# Patient Record
Sex: Male | Born: 1955 | Hispanic: No | Marital: Married | Smoking: Never smoker
Health system: Southern US, Community
[De-identification: ages and names within clinical notes are randomized; demographics above are authoritative.]

## PROBLEM LIST (undated history)

## (undated) DIAGNOSIS — E781 Pure hyperglyceridemia: Secondary | ICD-10-CM

## (undated) DIAGNOSIS — E119 Type 2 diabetes mellitus without complications: Secondary | ICD-10-CM

## (undated) DIAGNOSIS — H9192 Unspecified hearing loss, left ear: Secondary | ICD-10-CM

## (undated) DIAGNOSIS — K219 Gastro-esophageal reflux disease without esophagitis: Secondary | ICD-10-CM

## (undated) DIAGNOSIS — E785 Hyperlipidemia, unspecified: Secondary | ICD-10-CM

## (undated) DIAGNOSIS — D239 Other benign neoplasm of skin, unspecified: Secondary | ICD-10-CM

## (undated) DIAGNOSIS — N400 Enlarged prostate without lower urinary tract symptoms: Secondary | ICD-10-CM

## (undated) DIAGNOSIS — R7989 Other specified abnormal findings of blood chemistry: Secondary | ICD-10-CM

## (undated) DIAGNOSIS — Z9641 Presence of insulin pump (external) (internal): Secondary | ICD-10-CM

## (undated) DIAGNOSIS — I8390 Asymptomatic varicose veins of unspecified lower extremity: Secondary | ICD-10-CM

## (undated) DIAGNOSIS — M48061 Spinal stenosis, lumbar region without neurogenic claudication: Secondary | ICD-10-CM

## (undated) DIAGNOSIS — J45909 Unspecified asthma, uncomplicated: Secondary | ICD-10-CM

## (undated) DIAGNOSIS — M51369 Other intervertebral disc degeneration, lumbar region without mention of lumbar back pain or lower extremity pain: Secondary | ICD-10-CM

## (undated) DIAGNOSIS — M5417 Radiculopathy, lumbosacral region: Secondary | ICD-10-CM

## (undated) DIAGNOSIS — M1990 Unspecified osteoarthritis, unspecified site: Secondary | ICD-10-CM

## (undated) DIAGNOSIS — G4700 Insomnia, unspecified: Secondary | ICD-10-CM

## (undated) HISTORY — DX: Insomnia, unspecified: G47.00

## (undated) HISTORY — DX: Hyperlipidemia, unspecified: E78.5

## (undated) HISTORY — DX: Benign prostatic hyperplasia without lower urinary tract symptoms: N40.0

## (undated) HISTORY — DX: Unspecified hearing loss, left ear: H91.92

## (undated) HISTORY — DX: Other specified abnormal findings of blood chemistry: R79.89

## (undated) HISTORY — DX: Pure hyperglyceridemia: E78.1

## (undated) HISTORY — DX: Type 2 diabetes mellitus without complications: E11.9

## (undated) HISTORY — DX: Other benign neoplasm of skin, unspecified: D23.9

---

## 1984-05-09 DIAGNOSIS — E781 Pure hyperglyceridemia: Secondary | ICD-10-CM | POA: Insufficient documentation

## 1998-12-23 DIAGNOSIS — K219 Gastro-esophageal reflux disease without esophagitis: Secondary | ICD-10-CM | POA: Insufficient documentation

## 2003-11-04 ENCOUNTER — Encounter: Admission: RE | Admit: 2003-11-04 | Discharge: 2003-11-04 | Payer: Self-pay | Admitting: Internal Medicine

## 2004-05-22 ENCOUNTER — Ambulatory Visit (HOSPITAL_COMMUNITY): Admission: RE | Admit: 2004-05-22 | Discharge: 2004-05-22 | Payer: Self-pay | Admitting: Internal Medicine

## 2005-05-21 ENCOUNTER — Encounter: Admission: RE | Admit: 2005-05-21 | Discharge: 2005-05-21 | Payer: Self-pay | Admitting: Internal Medicine

## 2006-05-09 DIAGNOSIS — E119 Type 2 diabetes mellitus without complications: Secondary | ICD-10-CM | POA: Insufficient documentation

## 2006-08-04 ENCOUNTER — Inpatient Hospital Stay (HOSPITAL_COMMUNITY): Admission: AD | Admit: 2006-08-04 | Discharge: 2006-08-05 | Payer: Self-pay | Admitting: General Surgery

## 2006-08-05 ENCOUNTER — Encounter (INDEPENDENT_AMBULATORY_CARE_PROVIDER_SITE_OTHER): Payer: Self-pay | Admitting: *Deleted

## 2006-12-23 HISTORY — PX: CHOLECYSTECTOMY: SHX55

## 2008-07-24 ENCOUNTER — Emergency Department (HOSPITAL_COMMUNITY): Admission: EM | Admit: 2008-07-24 | Discharge: 2008-07-24 | Payer: Self-pay | Admitting: Emergency Medicine

## 2008-11-08 ENCOUNTER — Ambulatory Visit: Payer: Self-pay | Admitting: Gastroenterology

## 2008-11-22 ENCOUNTER — Ambulatory Visit: Payer: Self-pay | Admitting: Gastroenterology

## 2009-03-25 ENCOUNTER — Encounter: Admission: RE | Admit: 2009-03-25 | Discharge: 2009-03-25 | Payer: Self-pay | Admitting: Orthopaedic Surgery

## 2009-05-09 DIAGNOSIS — D239 Other benign neoplasm of skin, unspecified: Secondary | ICD-10-CM | POA: Insufficient documentation

## 2011-05-10 NOTE — H&P (Signed)
NAME:  Ethan Booth, NULL NO.:  [id number]   MEDICAL RECORD NO.:  [id number]          PATIENT TYPE:  INP   LOCATION:  6709                         FACILITY:  [facility]   PHYSICIAN:  Leonie Man, M.D.   DATE OF BIRTH:  01/30/1956   DATE OF ADMISSION:  08/04/2006  DATE OF DISCHARGE:                                HISTORY & PHYSICAL   PROBLEM:  Acute cholecystitis.   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  He was evaluated at [facility] within the past 24 hours, at which time his cardiac work-up was  completely negative, and EKG, CK-MB and troponins were all within normal  limits.  He was noted to have elevated liver function studies.  He underwent  an abdominal ultrasound at [facility] here in [place] which  showed cholelithiasis with some mild gallbladder wall thickening, no  dilatation of the common bile duct.  There were multiple small stones within  the gallbladder.   PAST MEDICAL HISTORY:  1. The patient has a history of type 2 diabetes.  2. Hyperlipidemia.  3. Gastroesophageal reflux.   MEDICATIONS:  1. Actos 45 mg daily.  2. Lipitor 10 mg daily.  3. Aspirin 81 mg daily.  4. AcipHex 20 mg daily.  5. Omega 3 at 2 gm daily.  6. AndroGel two pumps daily.   PAST SURGICAL HISTORY:  A tonsillectomy and adenoidectomy in the remote  past.   ALLERGIES:  No known drug allergies.  No known substance allergies.   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  Stable supportive family.  No tobacco use.  No alcohol  use.  No illicit drug use history.   FAMILY HISTORY:  Positive for hypertension and diabetes.   REVIEW OF SYSTEMS:  Negative in detail, except as  outlined above.   PHYSICAL EXAMINATION:  GENERAL:  Well-developed, well-nourished male.  VITAL SIGNS:  Within normal limits.  HEENT/NECK:  No thyromegaly, no cervical adenopathy.  Oropharynx is benign.  Pupils round and regular.  No scleral icterus.  LUNGS:  Clear to auscultation bilaterally.  CARDIOVASCULAR:  Regular rate and rhythm without murmurs.  There are no  carotid bruits, no abdominal bruits.  Extremity pulses are full and equal  bilaterally.  ABDOMEN:  Flat.  There is mild to moderate tenderness of the epigastrium and  right upper quadrant.  There is some voluntary muscular spasm.  There are no  palpable masses.  Bowel sounds are hypoactive.  EXTREMITIES:  Range of motion within normal limits.  No edema, no calf  tenderness.  NEUROLOGIC:  Screening examination shows the patient to move all 4  extremities well.  There are no lateralizing motor sensory signs in this  alert, oriented male.   ASSESSMENT:  1. Subacute cholecystitis.  2. Diabetes mellitus.  3. Hyperlipidemia.  4. Gastroesophageal reflux.  PLAN:  Will begin him on IV antibiotics.  We will repeat liver function  studies, lipase and amylase, and decision for laparoscopic cholecystectomy  and intraoperative cholangiogram.      Leonie Man, M.D.  Electronically Signed     PB/MEDQ  D:  08/04/2006  T:  08/04/2006  Job:  [id number]

## 2011-05-10 NOTE — Op Note (Signed)
NAME:  TIWAN, SCHNITKER NO.:  [id number]   MEDICAL RECORD NO.:  [id number]          PATIENT TYPE:  INP   LOCATION:  6709                         FACILITY:  [facility]   PHYSICIAN:  Leonie Man, M.D.   DATE OF BIRTH:  04/17/1956   DATE OF PROCEDURE:  08/04/2006  DATE OF DISCHARGE:                                 OPERATIVE REPORT   PREOPERATIVE DIAGNOSIS:  Acute cholecystitis.   POSTOPERATIVE DIAGNOSIS:  Acute cholecystitis.   PROCEDURE:  Laparoscopic cholecystectomy with interoperative cholangiogram.   SURGEON:  Leonie Man, M.D.   ASSISTANT:  Sharlet Salina T. Hoxworth, M.D.   ANESTHESIA:  General.   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  Evaluation by gallbladder ultrasound shows  multiple small gallstones within the gallbladder without any evidence of  ductal dilatation. Liver function studies are moderately elevated with a  bilirubin elevation up to 3.3, subsequently lowered to 1.6.  The patient  comes to the operating room now, after the risks and potential benefits of  surgery have been discussed with him, and he understands and gives his  consent to same.   PROCEDURE:  Following the induction of satisfactory general endotracheal  anesthesia, the abdomen is prepped and draped to be included in a sterile  operative field.  Open laparoscopy was created at the umbilicus. With  insertion of a Hassan cannula and insufflation of the peritoneal cavity to  14 mmHg pressure using carbon dioxide.  The camera was inserted and visual  exploration of the abdomen carried out.  There were a few adhesions to the  region of the ampulla of the gallbladder with tethering of the duodenum.  The gallbladder showed some mild to moderate wall thickening.  The liver  edges were sharp and the liver surfaces smooth.  The anterior gastric wall  and  duodenal sweep appeared to be normal.  None of the small or large  intestine visualized appeared to be abnormal.   Under direct vision, epigastric and lateral ports were placed.  The  gallbladder is grasped and retracted cephalad with adhesions to the  gallbladder wall taken down serially. The cystic duct and cystic artery were  isolated, the cystic duct reaches up to the cystic duct gallbladder junction  and the cystic artery traced up to its entry into the wall of the  gallbladder.  The cystic duct was clipped proximally and opened and the  cystic duct cholangiogram was carried out by placing a Cook catheter into  the abdomen and inserting it into the cystic duct.  The resulting  cholangiogram showed prompt flow of contrast into the duodenum with normal  tapering of the distal common bile duct.  No filling defects noted within  any of the hepatic radicles or within any of the extrahepatic ducts.  The  cystic duct catheter was then removed and the cystic duct triply clipped and  transected.  The cystic artery was doubly clipped and transected.  The  gallbladder is dissected free  from the liver bed using electrocautery and  maintaining hemostasis throughout the entire course of dissection.  At the  end of the dissection, inspection of the liver bed was carried out.  Additional bleeding points were treated with electrocautery.  I placed a  small Surgicel pack within the gallbladder bed for additional hemostasis.  The gallbladder was then placed in an Endopouch and retrieved through the  umbilical wound without difficulty.  The right upper quadrant was thoroughly  irrigated with multiple aliquots of normal saline and aspirated dry.  The  lateral trocars were then removed under direct vision.  The pneumoperitoneum  was allowed to deflate.  Sponge, instrument and sharp counts were doubly  verified and the incisions were closed in layers as follows.  The umbilical  wound in two layers with 0  Vicryl and 4-0 Monocryl, epigastrium and flank  wounds were closed with 4-0 Monocryl sutures.  Sterile dressings were  applied.  The anesthetic was reversed.  The patient was removed from the  operating room to the recovery room in stable condition.  He tolerated this  procedure well.      Leonie Man, M.D.  Electronically Signed     PB/MEDQ  D:  08/05/2006  T:  08/05/2006  Job:  [id number]

## 2011-09-27 LAB — GLUCOSE, CAPILLARY
Glucose-Capillary: 121 mg/dL — ABNORMAL HIGH (ref 70–99)
Glucose-Capillary: 136 mg/dL — ABNORMAL HIGH (ref 70–99)

## 2012-09-08 ENCOUNTER — Other Ambulatory Visit: Payer: Self-pay | Admitting: Dermatology

## 2013-09-24 ENCOUNTER — Other Ambulatory Visit (HOSPITAL_COMMUNITY): Payer: Self-pay | Admitting: Otolaryngology

## 2013-09-24 DIAGNOSIS — R070 Pain in throat: Secondary | ICD-10-CM

## 2013-09-29 ENCOUNTER — Other Ambulatory Visit (HOSPITAL_COMMUNITY): Payer: Self-pay

## 2014-01-24 ENCOUNTER — Telehealth: Payer: Self-pay

## 2014-01-24 ENCOUNTER — Other Ambulatory Visit: Payer: Self-pay | Admitting: Nurse Practitioner

## 2014-01-24 ENCOUNTER — Ambulatory Visit (INDEPENDENT_AMBULATORY_CARE_PROVIDER_SITE_OTHER): Payer: 59

## 2014-01-24 DIAGNOSIS — R52 Pain, unspecified: Secondary | ICD-10-CM

## 2014-01-24 DIAGNOSIS — M25519 Pain in unspecified shoulder: Secondary | ICD-10-CM

## 2014-01-24 DIAGNOSIS — M25511 Pain in right shoulder: Secondary | ICD-10-CM

## 2014-01-24 NOTE — Telephone Encounter (Signed)
Pt wants xr shoulder rt  ok'd per mmmm

## 2014-02-08 ENCOUNTER — Other Ambulatory Visit (HOSPITAL_COMMUNITY): Payer: Self-pay | Admitting: Orthopedic Surgery

## 2014-02-08 DIAGNOSIS — M25511 Pain in right shoulder: Secondary | ICD-10-CM

## 2014-02-08 DIAGNOSIS — M25561 Pain in right knee: Secondary | ICD-10-CM

## 2014-02-16 ENCOUNTER — Ambulatory Visit (HOSPITAL_COMMUNITY)
Admission: RE | Admit: 2014-02-16 | Discharge: 2014-02-16 | Disposition: A | Discharge status: Home / Self Care | Payer: 59 | Source: Ambulatory Visit | Attending: Orthopedic Surgery | Admitting: Orthopedic Surgery

## 2014-02-16 DIAGNOSIS — M2240 Chondromalacia patellae, unspecified knee: Secondary | ICD-10-CM | POA: Insufficient documentation

## 2014-02-16 DIAGNOSIS — M25561 Pain in right knee: Secondary | ICD-10-CM

## 2014-02-16 DIAGNOSIS — M25569 Pain in unspecified knee: Secondary | ICD-10-CM | POA: Insufficient documentation

## 2014-02-23 ENCOUNTER — Ambulatory Visit (HOSPITAL_COMMUNITY)
Admission: RE | Admit: 2014-02-23 | Discharge: 2014-02-23 | Disposition: A | Discharge status: Home / Self Care | Payer: 59 | Source: Ambulatory Visit | Attending: Orthopedic Surgery | Admitting: Orthopedic Surgery

## 2014-02-23 DIAGNOSIS — M719 Bursopathy, unspecified: Secondary | ICD-10-CM | POA: Insufficient documentation

## 2014-02-23 DIAGNOSIS — M25511 Pain in right shoulder: Secondary | ICD-10-CM

## 2014-02-23 DIAGNOSIS — M67919 Unspecified disorder of synovium and tendon, unspecified shoulder: Secondary | ICD-10-CM | POA: Insufficient documentation

## 2014-02-23 DIAGNOSIS — M25519 Pain in unspecified shoulder: Secondary | ICD-10-CM | POA: Insufficient documentation

## 2014-02-23 MED ORDER — IOHEXOL 300 MG/ML  SOLN
10.0000 mL | Freq: Once | INTRAMUSCULAR | Status: AC | PRN
  Administered 2014-02-23: 10 mL via INTRA_ARTICULAR

## 2014-02-23 MED ORDER — GADOBENATE DIMEGLUMINE 529 MG/ML IV SOLN
5.0000 mL | Freq: Once | INTRAVENOUS | Status: AC | PRN
  Administered 2014-02-23: 5 mL via INTRAVENOUS

## 2014-04-27 ENCOUNTER — Other Ambulatory Visit: Payer: Self-pay | Admitting: Nurse Practitioner

## 2014-04-27 MED ORDER — CIPROFLOXACIN HCL 500 MG PO TABS: 500.0000 mg | ORAL_TABLET | Freq: Two times a day (BID) | ORAL | Status: DC

## 2014-04-27 MED ORDER — SCOPOLAMINE 1 MG/3DAYS TD PT72: 1.0000 | MEDICATED_PATCH | TRANSDERMAL | Status: DC

## 2014-04-27 MED ORDER — DOXYCYCLINE HYCLATE 100 MG PO TABS: ORAL_TABLET | ORAL | Status: DC

## 2015-05-04 ENCOUNTER — Encounter: Payer: Self-pay | Admitting: Gastroenterology

## 2015-07-21 IMAGING — CR DG SHOULDER 2+V*R*
3 series · 3 of 3 positions shown · non-contrast
Comparison: None.

CLINICAL DATA: Shoulder pain.

EXAM:
RIGHT SHOULDER - 2+ VIEW

[view not recorded (1 of 3)]
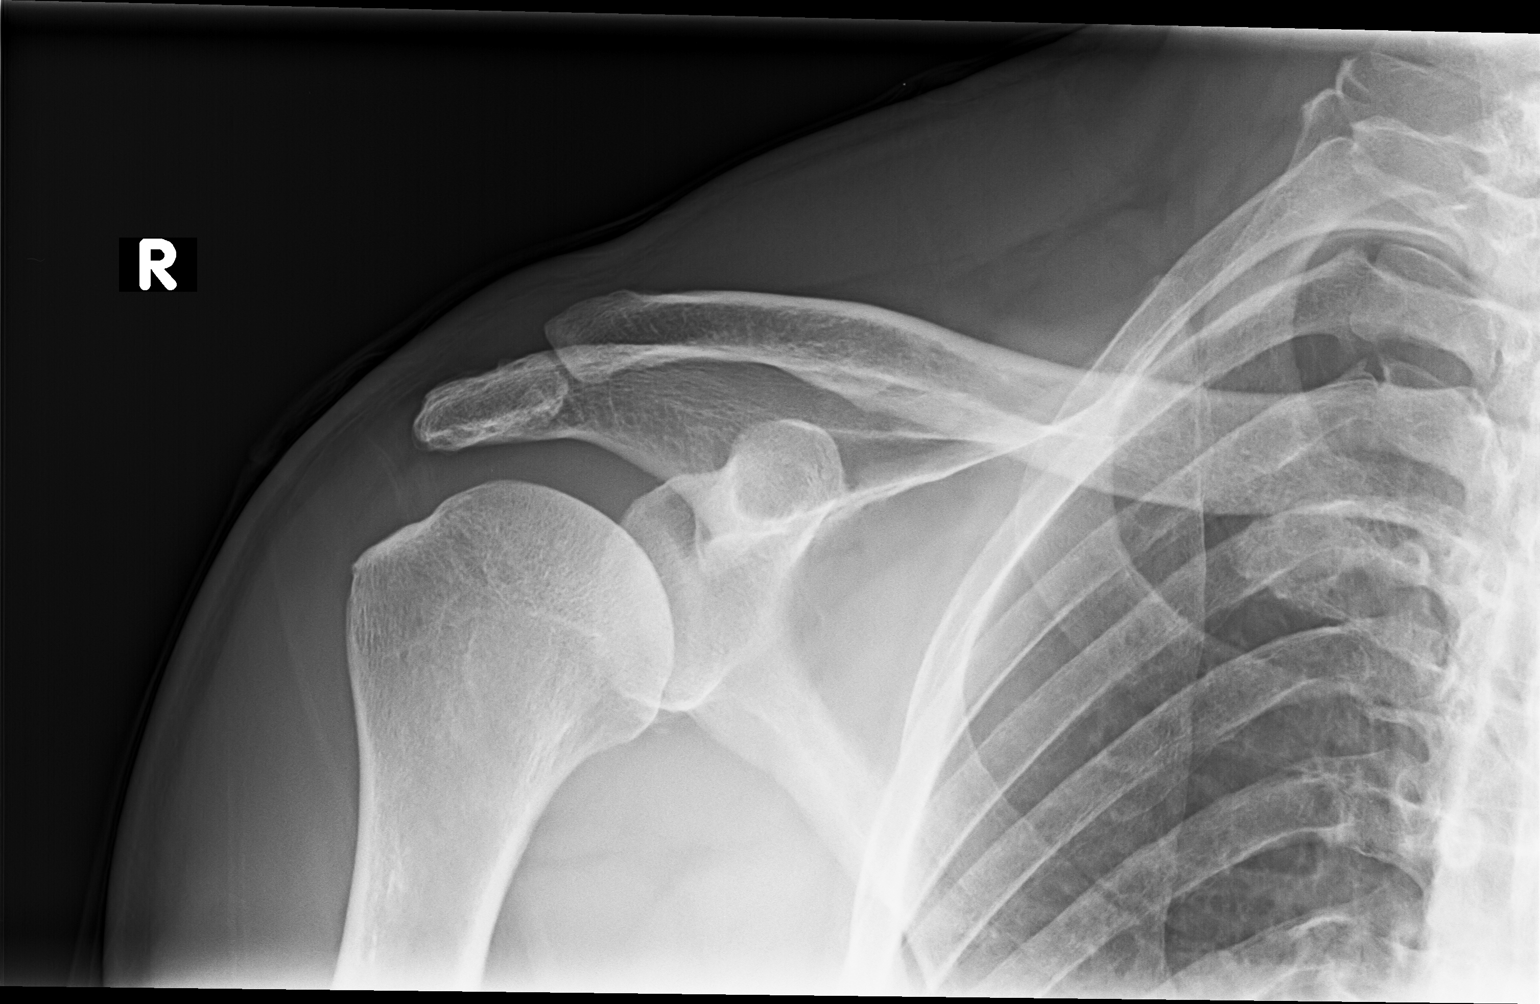

[view not recorded (2 of 3)]
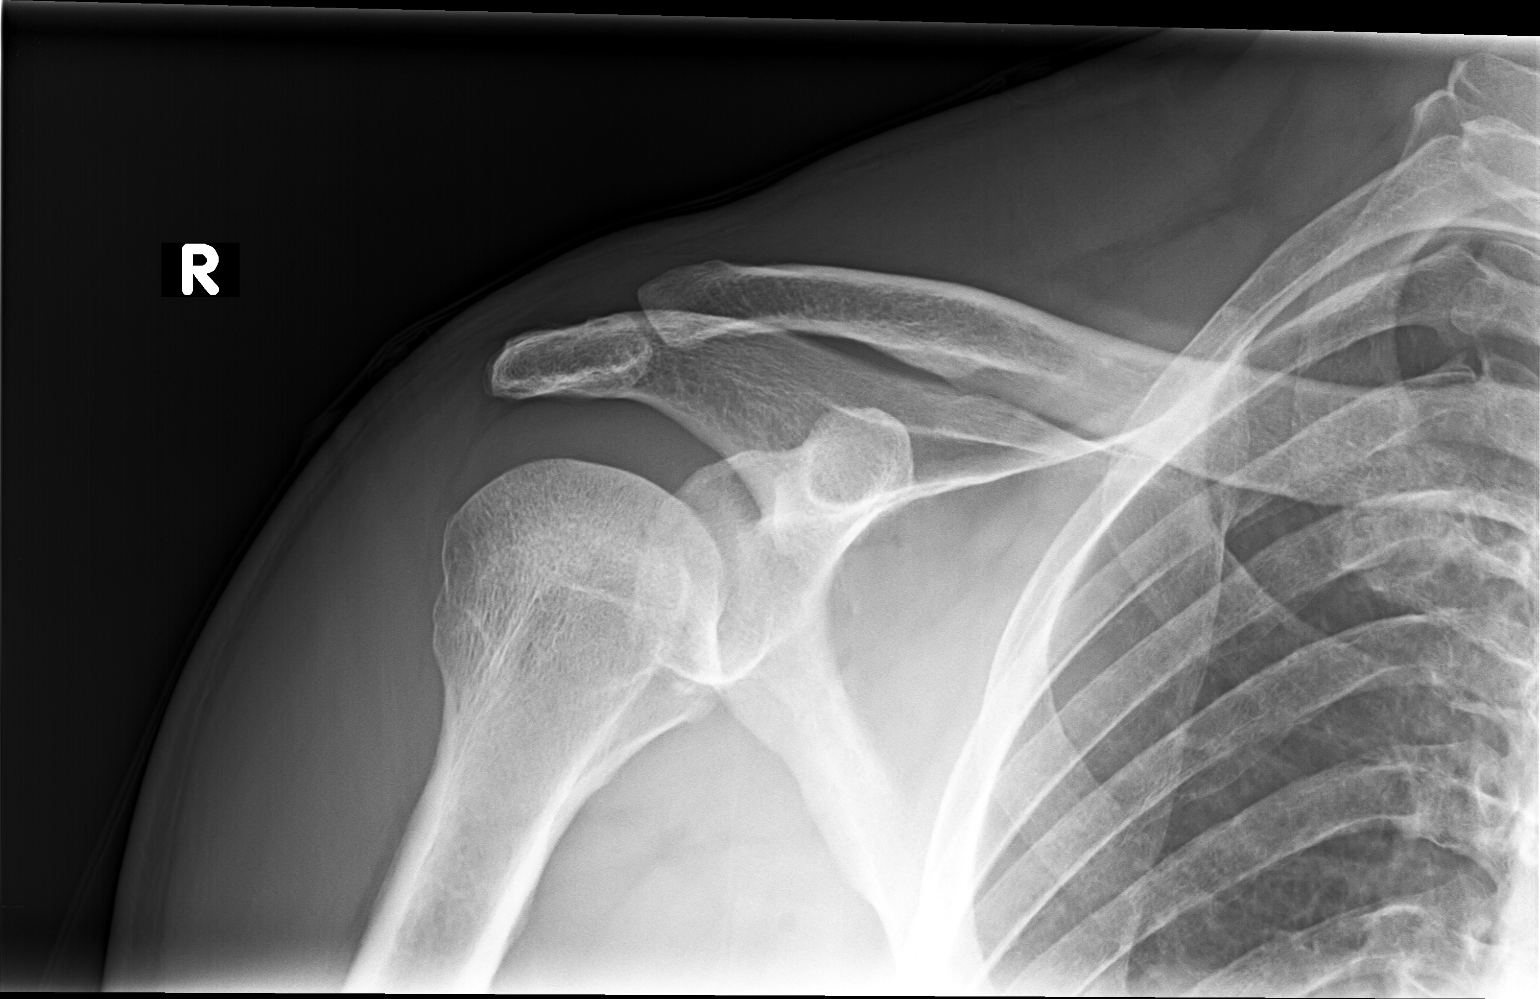

[view not recorded (3 of 3)]
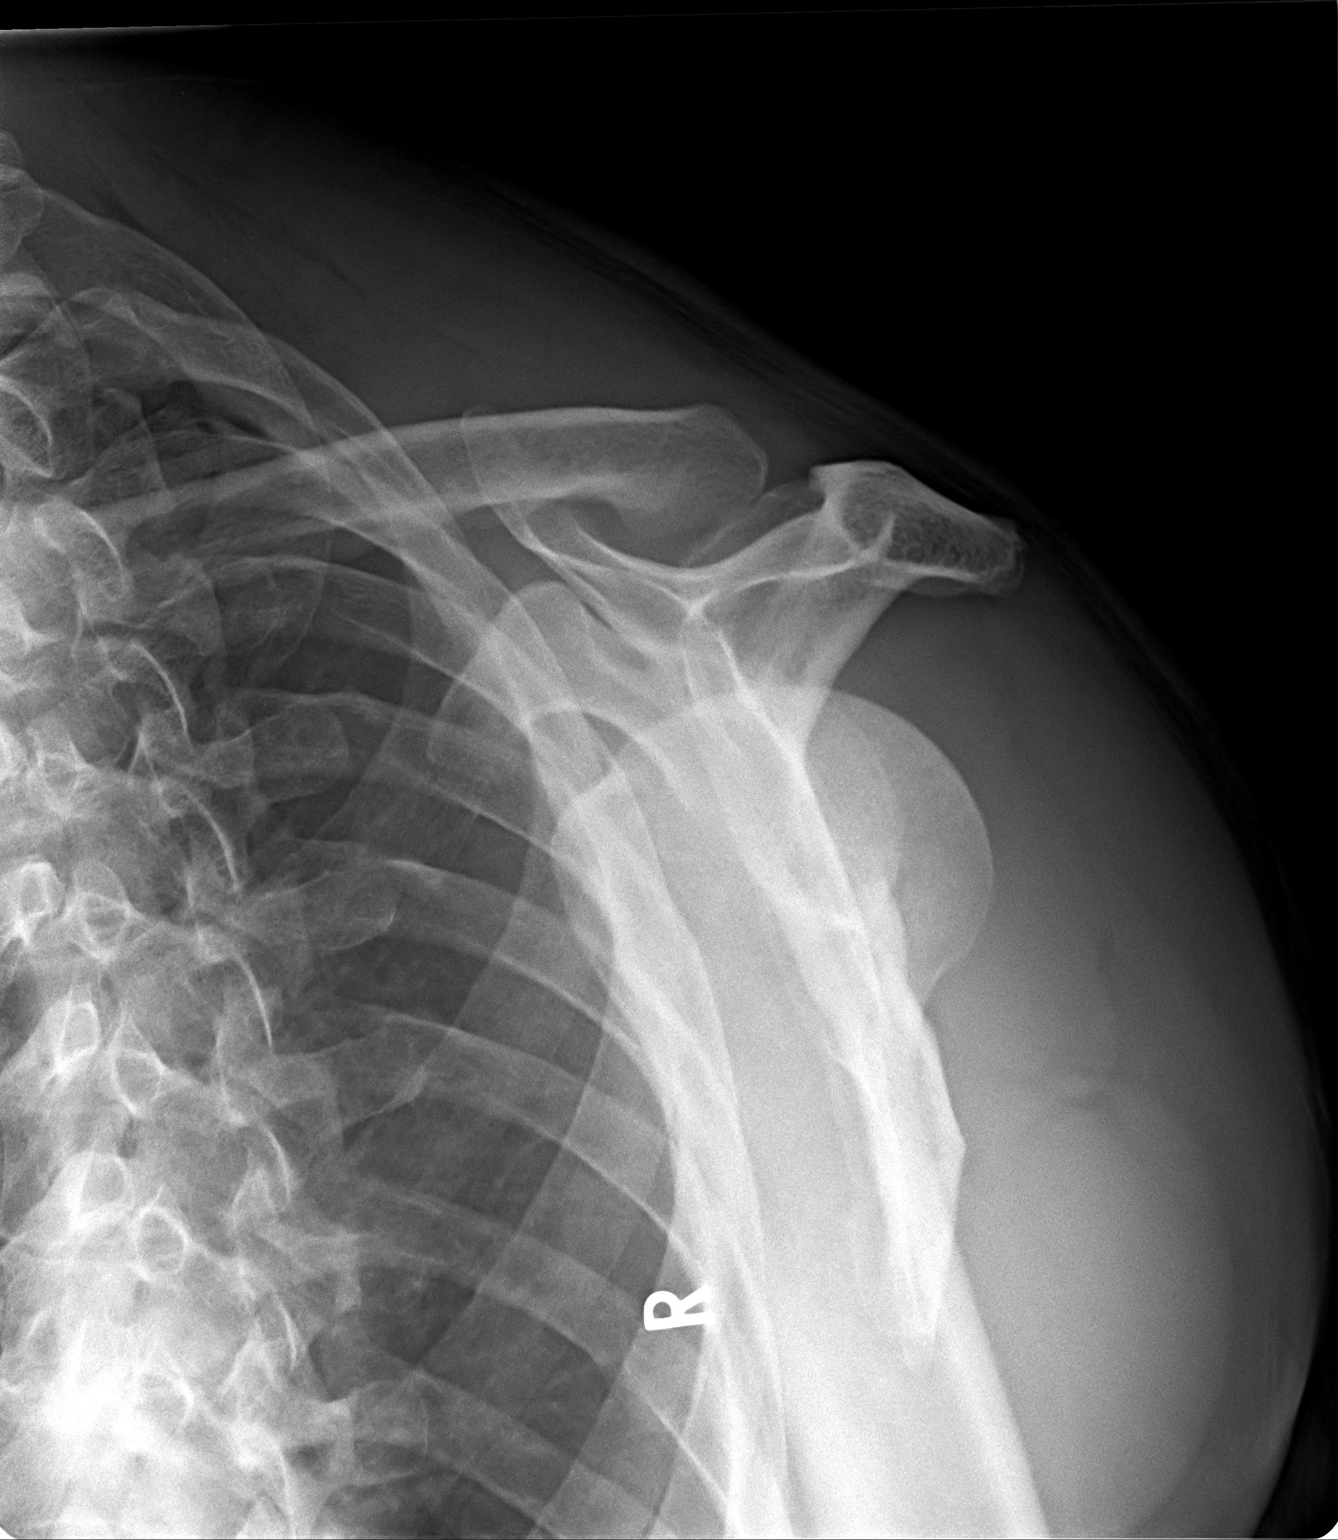

[3 of 3 positions shown; findings below may reference images not displayed]

FINDINGS: There is no evidence of fracture or dislocation. There is no
evidence of arthropathy or other focal bone abnormality. Soft
tissues are unremarkable.
IMPRESSION: Negative.

## 2016-03-06 ENCOUNTER — Other Ambulatory Visit: Payer: Self-pay | Admitting: Internal Medicine

## 2016-03-06 DIAGNOSIS — M5412 Radiculopathy, cervical region: Secondary | ICD-10-CM

## 2016-03-20 ENCOUNTER — Ambulatory Visit
Admission: RE | Admit: 2016-03-20 | Discharge: 2016-03-20 | Disposition: A | Discharge status: Home / Self Care | Payer: BLUE CROSS/BLUE SHIELD | Source: Ambulatory Visit | Attending: Internal Medicine | Admitting: Internal Medicine

## 2016-03-20 DIAGNOSIS — M5412 Radiculopathy, cervical region: Secondary | ICD-10-CM

## 2017-05-15 ENCOUNTER — Other Ambulatory Visit: Payer: Self-pay | Admitting: Surgery

## 2017-05-15 DIAGNOSIS — E041 Nontoxic single thyroid nodule: Secondary | ICD-10-CM

## 2018-12-27 ENCOUNTER — Encounter: Payer: Self-pay | Admitting: Gastroenterology

## 2020-11-21 ENCOUNTER — Other Ambulatory Visit: Payer: Self-pay | Admitting: Internal Medicine

## 2020-11-21 DIAGNOSIS — R3129 Other microscopic hematuria: Secondary | ICD-10-CM

## 2020-11-23 ENCOUNTER — Ambulatory Visit
Admission: RE | Admit: 2020-11-23 | Discharge: 2020-11-23 | Disposition: A | Discharge status: Home / Self Care | Payer: BLUE CROSS/BLUE SHIELD | Source: Ambulatory Visit | Attending: Internal Medicine | Admitting: Internal Medicine

## 2020-11-23 DIAGNOSIS — R3129 Other microscopic hematuria: Secondary | ICD-10-CM

## 2021-03-21 ENCOUNTER — Other Ambulatory Visit (HOSPITAL_COMMUNITY): Payer: Self-pay | Admitting: *Deleted

## 2021-03-28 ENCOUNTER — Ambulatory Visit (HOSPITAL_BASED_OUTPATIENT_CLINIC_OR_DEPARTMENT_OTHER)
Admission: RE | Admit: 2021-03-28 | Discharge: 2021-03-28 | Disposition: A | Discharge status: Home / Self Care | Payer: PRIVATE HEALTH INSURANCE | Source: Ambulatory Visit | Attending: Cardiology | Admitting: Cardiology

## 2021-03-28 ENCOUNTER — Other Ambulatory Visit: Payer: Self-pay

## 2022-05-09 DIAGNOSIS — N400 Enlarged prostate without lower urinary tract symptoms: Secondary | ICD-10-CM | POA: Insufficient documentation

## 2022-08-20 ENCOUNTER — Ambulatory Visit (INDEPENDENT_AMBULATORY_CARE_PROVIDER_SITE_OTHER): Payer: No Typology Code available for payment source | Admitting: Sports Medicine

## 2022-08-20 VITALS — BP 143/87 | HR 102 | Ht 72.0 in | Wt 158.0 lb

## 2022-08-20 DIAGNOSIS — G5701 Lesion of sciatic nerve, right lower limb: Secondary | ICD-10-CM | POA: Diagnosis not present

## 2022-08-20 NOTE — Progress Notes (Signed)
Established Patient Office Visit  Subjective   Patient ID: Ethan Booth, male    DOB: 1956-06-04  Age: 66 y.o. MRN: [id number]  Right-sided gluteal pain.  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  He reports the pain has been present the past 6 months off-and-on nagging however approximately 2 weeks ago he increased his biking frequency which caused an increase in his pain.  He tried some naproxen with moderate relief.  He reports he feels like sometimes there is a fluctuance over the area of his discomfort.  He reports his pain is worse when he is crossing his leg or lying on his right side.  He reports his pain is there every day and feels like it is a dull deep ache.  He denies any numbness, tingling, loss of bowel or bladder sensation or pain radiating into his legs.  Of note his friend  had similar discomfort with fluctuance which she found out to be sarcoma so he has some slight worry.  He denies any fevers, chills, night sweats or unexplained weight loss.  ROSas listed above in HPI    Objective:     BP (!) 143/87   Pulse (!) 102   Ht 6' (1.829 m)   Wt 158 lb (71.7 kg)   BMI 21.43 kg/m   Physical Exam Vitals reviewed.  Constitutional:      General: He is not in acute distress.    Appearance: Normal appearance. He is not ill-appearing, toxic-appearing or diaphoretic.  Pulmonary:     Effort: Pulmonary effort is normal.  Neurological:     Mental Status: He is alert.   Right hip.  No obvious deformity or asymmetry.  No ecchymosis or edema.  He has tenderness to palpation along the muscle belly of the piriformis and glutes medius.  No tenderness to palpation over the greater trochanteric bursa or iliotibial band.  Range of motion with flexion, extension. He does have some decreased range of motion with internal rotation secondary to some discomfort.  5/5 strength hip flexion, 4/5 resisted hip abduction.  Negative Trendelenburg's.  Negative pain in low  back in FABER.  Negative logroll.  Ultrasound of Greater Trochanter-Right  Greater trochanter -visualized, no bony abnormality seen, no large trochanteric bursa was seen today Piriformis tendon-visualized, there are a few calcifications near the insertion of the piriformis tendon along with some hypoechoic fluid surrounding the area  Summary and additional findings-calcifications and hypoechoic fluid surrounding the piriformis tendon indicative of inflammation near the origin.  Ultrasound performed and interpretation by Royal Hawthorn B. Darrick Penna, MD and Lawrence Marseilles. 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  Patient was given handout at end of visit which detailed exercises for rehabilitation.  He was also extremities exercises during the visit.  He is to complete his exercises over the next 6 weeks and follow-up after completion.  He verbalized understanding.  He is not restricted at this time from activities however he may need to decrease his biking until pain has resolved with activities.  Patient verbalized understanding       Return in about 6 weeks (around 10/01/2022), or sooner if symptoms worsen or fail to improve.    Claudie Leach, DO  I observed and  examined the patient with the resident and agree with assessment and plan.  Note reviewed and modified by me. Sterling Big, MD

## 2022-08-20 NOTE — Assessment & Plan Note (Signed)
Based on clinical and radiographical exam symptoms can be attributed to piriformis syndrome.  Patient was given handout at end of visit which detailed exercises for rehabilitation.  He was also extremities exercises during the visit.  He is to complete his exercises over the next 6 weeks and follow-up after completion.  He verbalized understanding.  He is not restricted at this time from activities however he may need to decrease his biking until pain has resolved with activities.  Patient verbalized understanding

## 2022-09-04 ENCOUNTER — Other Ambulatory Visit: Payer: Self-pay | Admitting: Urology

## 2022-09-22 IMAGING — CT CT CARDIAC CORONARY ARTERY CALCIUM SCORE
2 series · 15 of 20 positions shown, 17 images · non-contrast
Comparison: None.
COMPARISON: None.

Addendum:
EXAM:
OVER-READ INTERPRETATION  CT CHEST

The following report is an over-read performed by radiologist Dr.
Enma Turay [REDACTED] on 03/28/2021. This over-read
does not include interpretation of cardiac or coronary anatomy or
pathology. The coronary calcium score interpretation by the
cardiologist is attached.
CLINICAL DATA: Cardiovascular Disease Risk stratification
Coronary Calcium Score
TECHNIQUE: A gated, non-contrast computed tomography scan of the heart was
performed using 3mm slice thickness. Axial images were analyzed on a
dedicated workstation. Calcium scoring of the coronary arteries was
performed using the Agatston method.

[Series 2: casc 3.0 i36f 2 (id) · axial · 0.39mm/px · z∈[-252,-138]mm · 8 of 50 slices shown, 10 images]
[im 6/50  vessel]
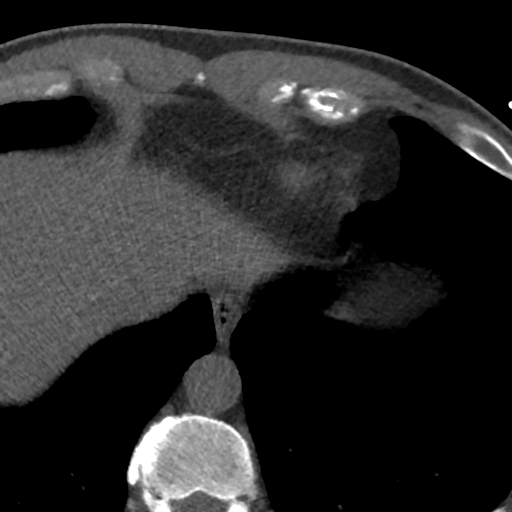
[im 6/50  lung]
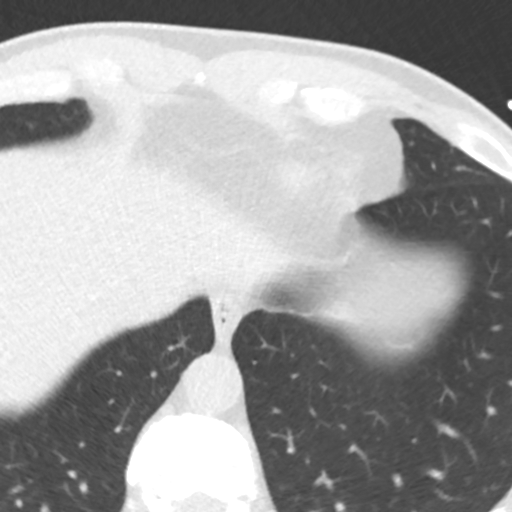
[im 11/50  vessel]
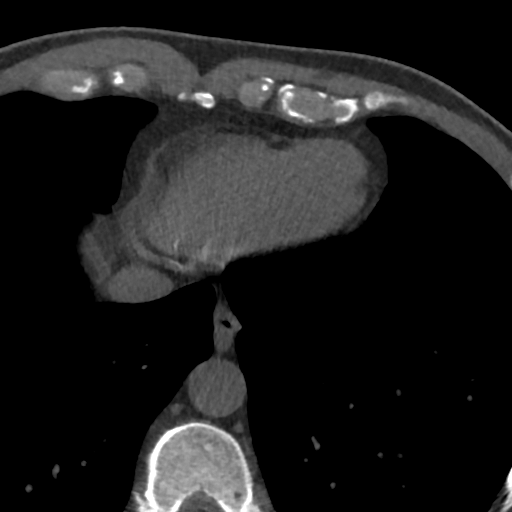
[im 17/50  vessel]
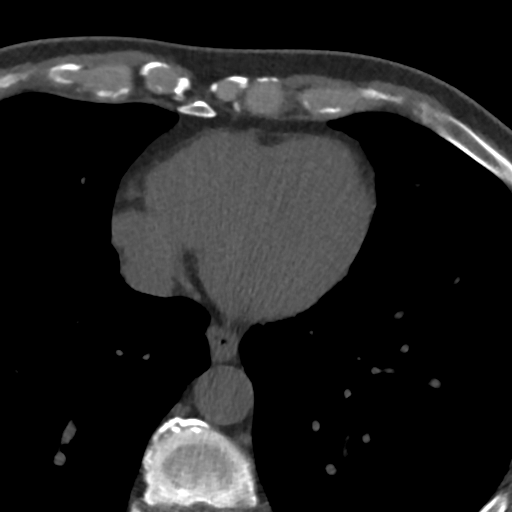
[im 22/50  vessel]
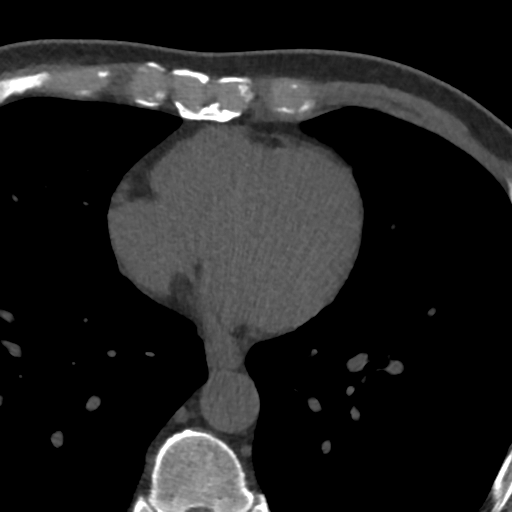
[im 28/50  vessel]
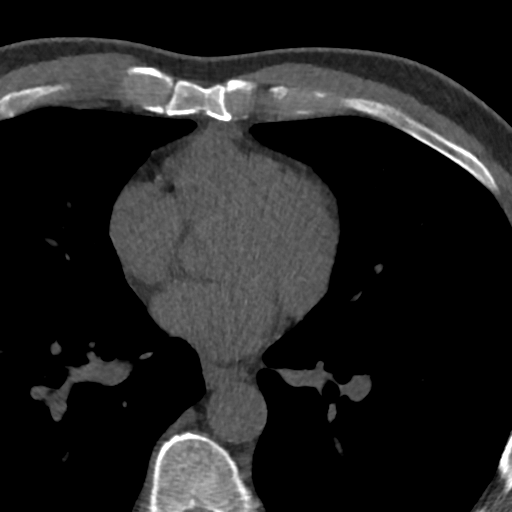
[im 28/50  lung]
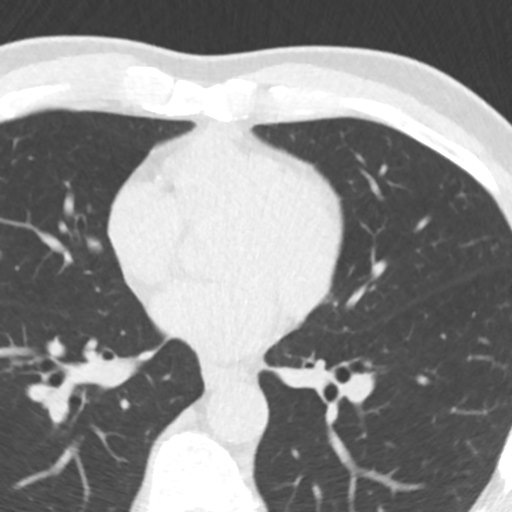
[im 33/50  vessel]
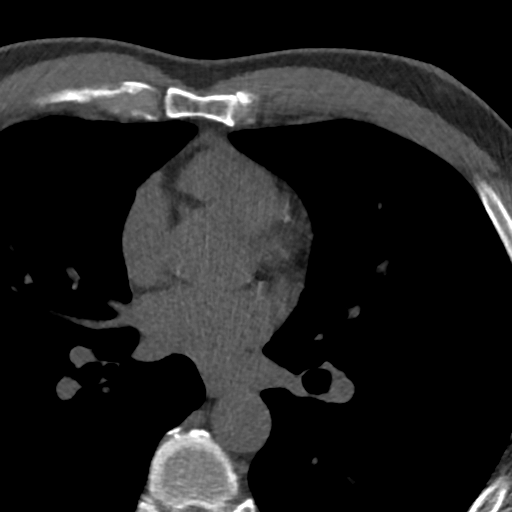
[im 39/50  vessel]
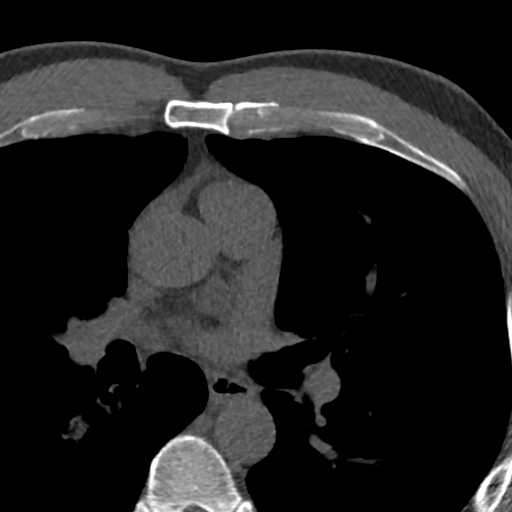
[im 44/50  vessel]
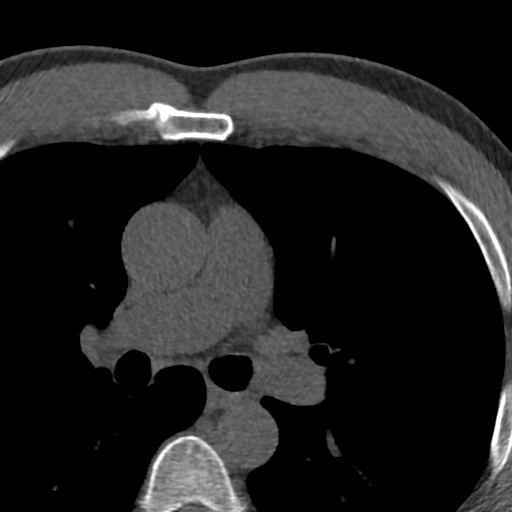

[Series 4: lung st · axial · 0.72mm/px · z∈[-252,-153]mm · 7 of 50 slices shown]
[im 6/50  lung]
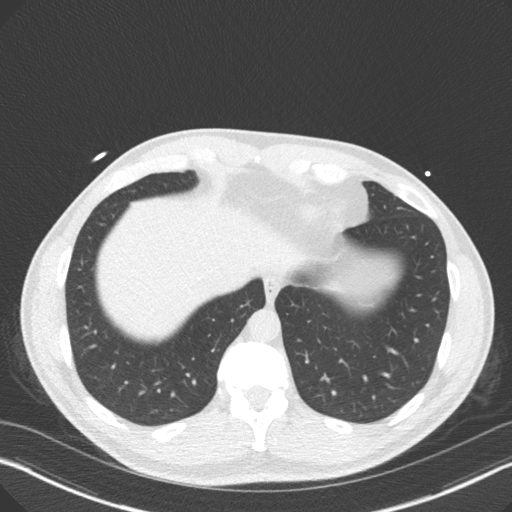
[im 11/50  lung]
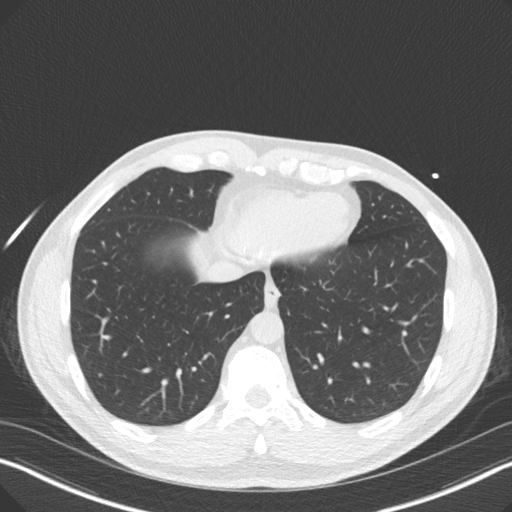
[im 17/50  lung]
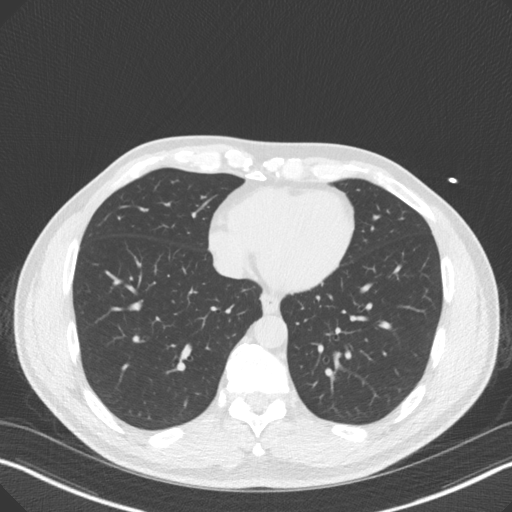
[im 22/50  lung]
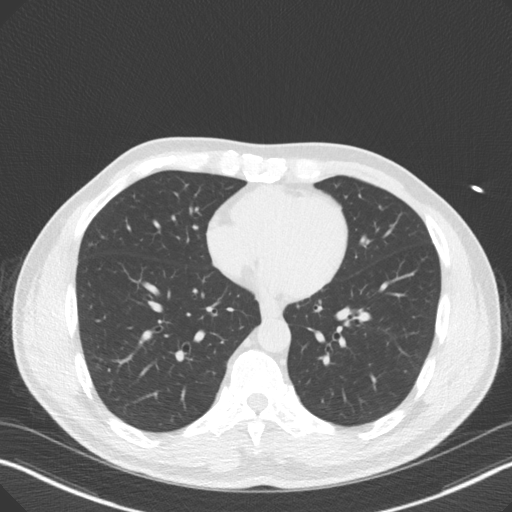
[im 28/50  lung]
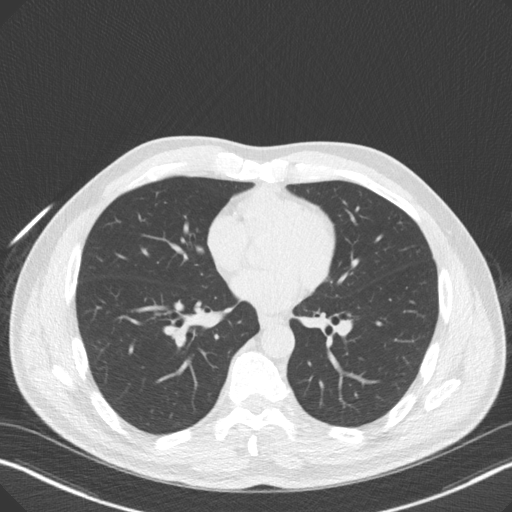
[im 33/50  lung]
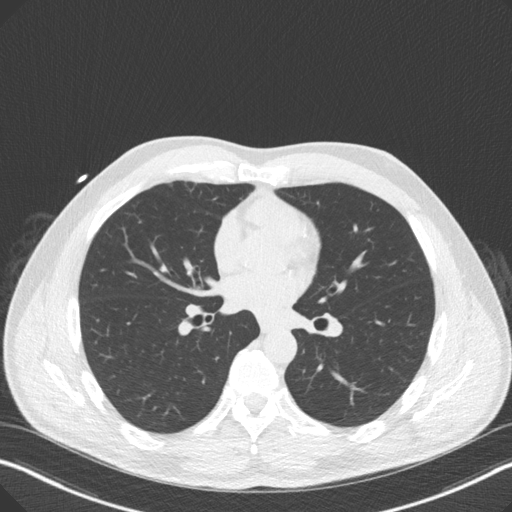
[im 39/50  lung]
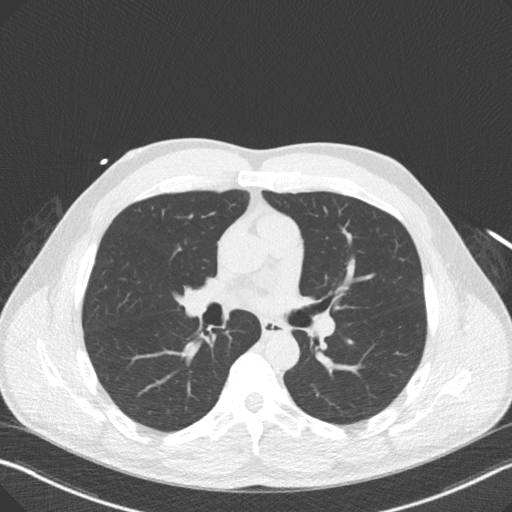

[15 of 20 positions shown; findings below may reference images not displayed]

FINDINGS: Vascular: Ascending thoracic aorta measures up to 3.2 cm. Descending
thoracic aorta measures 2.5 cm. Heart size is normal. No significant
pericardial fluid.

Mediastinum/Nodes: Visualized mediastinal structures are
unremarkable.

Lungs/Pleura: No large pleural effusions. Small density along the
right minor fissure has the configuration of a fissural lymph node
on the sequence 6, image 65. No significant airspace disease or
consolidation in the visualized lungs.

Upper Abdomen: Normal appearance of the upper abdomen.

Musculoskeletal: No acute bone abnormality.
IMPRESSION: No acute findings involving the extracardiac structures.
FINDINGS: Coronary arteries: Normal origins.

Coronary Calcium Score:

Left main: 0

Left anterior descending artery: 3

Left circumflex artery: 1

Right coronary artery: 2

Total: 6

Percentile: 46

Pericardium: Normal.

Ascending Aorta: Normal caliber.

Non-cardiac: See separate report from [REDACTED].
IMPRESSION: Coronary calcium score of 6. This was 46th percentile for age-,
race-, and sex-matched controls.



If CAC=0, it is reasonable to withhold statin therapy and reassess
in 5 to 10 years, as long as higher risk conditions are absent
(diabetes mellitus, family history of premature CHD in first degree
relatives (males <55 years; females <65 years), cigarette smoking,
or LDL >=190 mg/dL).

If CAC is 1 to 99, it is reasonable to initiate statin therapy for
patients >=55 years of age.

If CAC is >=100 or >=75th percentile, it is reasonable to initiate
statin therapy at any age.

Cardiology referral should be considered for patients with CAC
scores >=400 or >=75th percentile.

*7607 AHA/ACC/AACVPR/AAPA/ABC/SERAPIO/SAVAGE/ULUG/Puig/YING/AV/MORLES
Guideline on the Management of Blood Cholesterol: A Report of the
American College of Cardiology/American Heart Association Task Force
on Clinical Practice Guidelines. J Am Coll Cardiol.
7285;73(24):8133-8680.

*** End of Addendum ***
EXAM:
OVER-READ INTERPRETATION  CT CHEST

The following report is an over-read performed by radiologist Dr.
Enma Turay [REDACTED] on 03/28/2021. This over-read
does not include interpretation of cardiac or coronary anatomy or
pathology. The coronary calcium score interpretation by the
cardiologist is attached.
FINDINGS: Vascular: Ascending thoracic aorta measures up to 3.2 cm. Descending
thoracic aorta measures 2.5 cm. Heart size is normal. No significant
pericardial fluid.

Mediastinum/Nodes: Visualized mediastinal structures are
unremarkable.

Lungs/Pleura: No large pleural effusions. Small density along the
right minor fissure has the configuration of a fissural lymph node
on the sequence 6, image 65. No significant airspace disease or
consolidation in the visualized lungs.

Upper Abdomen: Normal appearance of the upper abdomen.

Musculoskeletal: No acute bone abnormality.
IMPRESSION: No acute findings involving the extracardiac structures.

## 2023-07-11 DIAGNOSIS — Z0000 Encounter for general adult medical examination without abnormal findings: Secondary | ICD-10-CM | POA: Diagnosis not present

## 2023-07-11 DIAGNOSIS — M5030 Other cervical disc degeneration, unspecified cervical region: Secondary | ICD-10-CM | POA: Diagnosis not present

## 2023-07-11 DIAGNOSIS — F5101 Primary insomnia: Secondary | ICD-10-CM | POA: Diagnosis not present

## 2023-07-11 DIAGNOSIS — Z23 Encounter for immunization: Secondary | ICD-10-CM | POA: Diagnosis not present

## 2023-07-11 DIAGNOSIS — Z1211 Encounter for screening for malignant neoplasm of colon: Secondary | ICD-10-CM | POA: Diagnosis not present

## 2023-07-22 ENCOUNTER — Other Ambulatory Visit: Payer: Self-pay | Admitting: Urology

## 2023-07-24 DIAGNOSIS — M79605 Pain in left leg: Secondary | ICD-10-CM | POA: Insufficient documentation

## 2023-08-12 DIAGNOSIS — E1165 Type 2 diabetes mellitus with hyperglycemia: Secondary | ICD-10-CM | POA: Diagnosis not present

## 2023-08-12 DIAGNOSIS — E7800 Pure hypercholesterolemia, unspecified: Secondary | ICD-10-CM | POA: Diagnosis not present

## 2023-08-12 DIAGNOSIS — E559 Vitamin D deficiency, unspecified: Secondary | ICD-10-CM | POA: Diagnosis not present

## 2023-12-03 DIAGNOSIS — E1165 Type 2 diabetes mellitus with hyperglycemia: Secondary | ICD-10-CM | POA: Diagnosis not present

## 2023-12-03 DIAGNOSIS — E7800 Pure hypercholesterolemia, unspecified: Secondary | ICD-10-CM | POA: Diagnosis not present

## 2023-12-03 DIAGNOSIS — E559 Vitamin D deficiency, unspecified: Secondary | ICD-10-CM | POA: Diagnosis not present

## 2023-12-10 DIAGNOSIS — E1165 Type 2 diabetes mellitus with hyperglycemia: Secondary | ICD-10-CM | POA: Diagnosis not present

## 2023-12-10 DIAGNOSIS — G629 Polyneuropathy, unspecified: Secondary | ICD-10-CM | POA: Diagnosis not present

## 2023-12-10 DIAGNOSIS — E559 Vitamin D deficiency, unspecified: Secondary | ICD-10-CM | POA: Diagnosis not present

## 2023-12-10 DIAGNOSIS — E7800 Pure hypercholesterolemia, unspecified: Secondary | ICD-10-CM | POA: Diagnosis not present

## 2023-12-11 DIAGNOSIS — L089 Local infection of the skin and subcutaneous tissue, unspecified: Secondary | ICD-10-CM | POA: Diagnosis not present

## 2023-12-11 DIAGNOSIS — L723 Sebaceous cyst: Secondary | ICD-10-CM | POA: Diagnosis not present

## 2023-12-12 DIAGNOSIS — K648 Other hemorrhoids: Secondary | ICD-10-CM | POA: Diagnosis not present

## 2023-12-12 DIAGNOSIS — Z860100 Personal history of colon polyps, unspecified: Secondary | ICD-10-CM | POA: Diagnosis not present

## 2023-12-12 DIAGNOSIS — K635 Polyp of colon: Secondary | ICD-10-CM | POA: Diagnosis not present

## 2023-12-12 DIAGNOSIS — K5730 Diverticulosis of large intestine without perforation or abscess without bleeding: Secondary | ICD-10-CM | POA: Diagnosis not present

## 2023-12-12 DIAGNOSIS — Z09 Encounter for follow-up examination after completed treatment for conditions other than malignant neoplasm: Secondary | ICD-10-CM | POA: Diagnosis not present

## 2023-12-19 DIAGNOSIS — K635 Polyp of colon: Secondary | ICD-10-CM | POA: Diagnosis not present

## 2023-12-31 DIAGNOSIS — R948 Abnormal results of function studies of other organs and systems: Secondary | ICD-10-CM | POA: Diagnosis not present

## 2023-12-31 DIAGNOSIS — R3121 Asymptomatic microscopic hematuria: Secondary | ICD-10-CM | POA: Diagnosis not present

## 2023-12-31 DIAGNOSIS — R3915 Urgency of urination: Secondary | ICD-10-CM | POA: Diagnosis not present

## 2024-01-06 DIAGNOSIS — E119 Type 2 diabetes mellitus without complications: Secondary | ICD-10-CM | POA: Diagnosis not present

## 2024-01-06 DIAGNOSIS — J069 Acute upper respiratory infection, unspecified: Secondary | ICD-10-CM | POA: Diagnosis not present

## 2024-01-07 DIAGNOSIS — J069 Acute upper respiratory infection, unspecified: Secondary | ICD-10-CM | POA: Diagnosis not present

## 2024-01-07 DIAGNOSIS — Z03818 Encounter for observation for suspected exposure to other biological agents ruled out: Secondary | ICD-10-CM | POA: Diagnosis not present

## 2024-01-19 DIAGNOSIS — L299 Pruritus, unspecified: Secondary | ICD-10-CM | POA: Diagnosis not present

## 2024-01-19 DIAGNOSIS — J029 Acute pharyngitis, unspecified: Secondary | ICD-10-CM | POA: Diagnosis not present

## 2024-01-19 DIAGNOSIS — R059 Cough, unspecified: Secondary | ICD-10-CM | POA: Diagnosis not present

## 2024-01-19 DIAGNOSIS — R042 Hemoptysis: Secondary | ICD-10-CM | POA: Diagnosis not present

## 2024-01-19 DIAGNOSIS — B349 Viral infection, unspecified: Secondary | ICD-10-CM | POA: Diagnosis not present

## 2024-01-23 DIAGNOSIS — R3129 Other microscopic hematuria: Secondary | ICD-10-CM | POA: Diagnosis not present

## 2024-01-23 DIAGNOSIS — E1165 Type 2 diabetes mellitus with hyperglycemia: Secondary | ICD-10-CM | POA: Diagnosis not present

## 2024-02-22 NOTE — Progress Notes (Deleted)
 NEW PATIENT Date of Service/Encounter:  02/22/24 Referring provider: {Blank single:19197::"Polite, Windy Fast, MD","none-self referred"} Primary care provider: Thana Ates, MD  Subjective:  Ethan Booth is a 68 y.o. male with a PMHx of insomnia, BPPV, BPH, DM-II, GERD presenting today for evaluation of exercise induced asthma History obtained from: chart review and {Persons; PED relatives w/patient:19415::"patient"}.   Discussed the use of AI scribe software for clinical note transcription with the patient, who gave verbal consent to proceed.  History of Present Illness            Chart Review:  Reviewed PCP notes from referral 01/29/24: eval for drug reaction. Reported intense pruritus after hydrocodone cough syrup.   Other allergy screening: Asthma: {Blank single:19197::"yes","no"} Rhino conjunctivitis: {Blank single:19197::"yes","no"} Food allergy: {Blank single:19197::"yes","no"} Medication allergy: {Blank single:19197::"yes","no"} Hymenoptera allergy: {Blank single:19197::"yes","no"} Urticaria: {Blank single:19197::"yes","no"} Eczema:{Blank single:19197::"yes","no"} History of recurrent infections suggestive of immunodeficency: {Blank single:19197::"yes","no"} ***Vaccinations are up to date.   Past Medical History: No past medical history on file. Medication List:  Current Outpatient Medications  Medication Sig Dispense Refill   ciprofloxacin (CIPRO) 500 MG tablet Take 1 tablet (500 mg total) by mouth 2 (two) times daily. 20 tablet 0   doxycycline (VIBRA-TABS) 100 MG tablet 1 po BID 60 tablet 0   scopolamine (TRANSDERM-SCOP) 1 MG/3DAYS Place 1 patch (1.5 mg total) onto the skin every 3 (three) days. 10 patch 1   silodosin (RAPAFLO) 8 MG CAPS capsule TAKE 1 CAPSULE BY MOUTH DAILY 90 capsule 3   No current facility-administered medications for this visit.   Known Allergies:  Not on File Past Surgical History: *** The histories are not reviewed yet. Please review  them in the "History" navigator section and refresh this SmartLink. Family History: No family history on file. Social History: Apoll[place] lives ***.   ROS:  All other systems negative except as noted per HPI.  Objective:  There were no vitals taken for this visit. There is no height or weight on file to calculate BMI. Physical Exam:  General Appearance:  Alert, cooperative, no distress, appears stated age  Head:  Normocephalic, without obvious abnormality, atraumatic  Eyes:  Conjunctiva clear, EOM's intact  Ears {Blank multiple:19196:a:"***","EACs normal bilaterally","normal TMs bilaterally","ear tubes present bilaterally without exudate"}  Nose: Nares normal, {Blank multiple:19196:a:"***","hypertrophic turbinates","normal mucosa","no visible anterior polyps","septum midline"}  Throat: Lips, tongue normal; teeth and gums normal, {Blank multiple:19196:a:"***","normal posterior oropharynx","tonsils 2+","tonsils 3+","no tonsillar exudate","+ cobblestoning","surgically absent tonsils","mildly erythematous posterior oropharynx"}  Neck: Supple, symmetrical  Lungs:   {Blank multiple:19196:a:"***","clear to auscultation bilaterally","end-expiratory wheezing","wheezing throughout"}, Respirations unlabored, {Blank multiple:19196:a:"***","no coughing","intermittent dry coughing","intermittent productive-sounding cough"}  Heart:  {Blank multiple:19196:a:"***","regular rate and rhythm","no murmur"}, Appears well perfused  Extremities: No edema  Skin: {Blank multiple:19196:a:"***","erythematous, dry patches scattered on ***","lichenification on ***","Skin color, texture, turgor normal","no rashes or lesions on visualized portions of skin"}  Neurologic: No gross deficits   Diagnostics: Spirometry:  Tracings reviewed. His effort: {Blank single:19197::"Good reproducible efforts.","It was hard to get consistent efforts and there is a question as to whether this reflects a maximal maneuver.","Poor effort, data  can not be interpreted.","Variable effort-results affected","effort okay for first attempt at spirometry.","Results not reproducible due to ***"} FVC: ***L (pre), ***L  (post) FEV1: ***L, ***% predicted (pre), ***L, ***% predicted (post) FEV1/FVC ratio: *** (pre), *** (post) Interpretation: {Blank single:19197::"Spirometry consistent with mild obstructive disease","Spirometry consistent with moderate obstructive disease","Spirometry consistent with severe obstructive disease","Spirometry consistent with possible restrictive disease","Spirometry consistent with mixed obstructive and restrictive disease","Spirometry uninterpretable due to technique","Spirometry consistent with normal pattern","No overt  abnormalities noted given today's efforts","Nonobstructive ratio, low FEV1","Nonobstructive ratio, low FEV1, possible restriction"}.  Please see scanned spirometry results for details.  Skin Testing: {Blank single:19197::"Select foods","Environmental allergy panel","Environmental allergy panel and select foods","Food allergy panel","None","Deferred due to recent antihistamines use","deferred due to recent reaction","Pediatric Environmental Allergy Panel","Pediatric Food Panel","Select foods and environmental allergies"}. {Blank single:19197::"Adequate positive and negative controls","Inadequate positive control-testing invalid","Adequate positive and negative controls, dermatographism present, testing difficult to interpret"}. Results discussed with patient/family.   {Blank single:19197::"Allergy testing results were read and interpreted by myself, documented by clinical staff.","Allergy testing results were read by ***,FNP, documented by clinical staff"}  Labs:  Lab Orders  No laboratory test(s) ordered today     Assessment and Plan  Assessment and Plan               {Blank single:19197::"This note in its entirety was forwarded to the Provider who requested this consultation."}  Other:  {Blank multiple:19196:a:"***","samples provided of: ***","school forms provided","reviewed spirometry technique","reviewed inhaler technique"}  Thank you for your kind referral. I appreciate the opportunity to take part in Saleh's care. Please do not hesitate to contact me with questions.***  Sincerely,  Tonny Bollman, MD [facility]

## 2024-02-23 ENCOUNTER — Ambulatory Visit: Payer: Medicare Other | Admitting: Internal Medicine

## 2024-03-05 DIAGNOSIS — M48061 Spinal stenosis, lumbar region without neurogenic claudication: Secondary | ICD-10-CM | POA: Insufficient documentation

## 2024-03-06 DIAGNOSIS — M51369 Other intervertebral disc degeneration, lumbar region without mention of lumbar back pain or lower extremity pain: Secondary | ICD-10-CM | POA: Insufficient documentation

## 2024-03-22 DIAGNOSIS — Z889 Allergy status to unspecified drugs, medicaments and biological substances status: Secondary | ICD-10-CM | POA: Diagnosis not present

## 2024-03-22 DIAGNOSIS — I83892 Varicose veins of left lower extremities with other complications: Secondary | ICD-10-CM | POA: Diagnosis not present

## 2024-03-22 DIAGNOSIS — M5441 Lumbago with sciatica, right side: Secondary | ICD-10-CM | POA: Diagnosis not present

## 2024-03-22 DIAGNOSIS — R3129 Other microscopic hematuria: Secondary | ICD-10-CM | POA: Diagnosis not present

## 2024-03-22 DIAGNOSIS — M79645 Pain in left finger(s): Secondary | ICD-10-CM | POA: Diagnosis not present

## 2024-03-29 ENCOUNTER — Other Ambulatory Visit (HOSPITAL_COMMUNITY): Payer: Self-pay | Admitting: Internal Medicine

## 2024-03-29 DIAGNOSIS — M5441 Lumbago with sciatica, right side: Secondary | ICD-10-CM

## 2024-03-29 DIAGNOSIS — M5442 Lumbago with sciatica, left side: Secondary | ICD-10-CM

## 2024-04-01 ENCOUNTER — Other Ambulatory Visit: Payer: Self-pay

## 2024-04-01 ENCOUNTER — Ambulatory Visit: Admitting: Sports Medicine

## 2024-04-01 VITALS — BP 126/78 | Ht 71.5 in | Wt 160.0 lb

## 2024-04-01 DIAGNOSIS — M79662 Pain in left lower leg: Secondary | ICD-10-CM

## 2024-04-01 DIAGNOSIS — M5416 Radiculopathy, lumbar region: Secondary | ICD-10-CM

## 2024-04-01 MED ORDER — GABAPENTIN 300 MG PO CAPS: 300.0000 mg | ORAL_CAPSULE | Freq: Three times a day (TID) | ORAL | 2 refills | Status: DC

## 2024-04-01 NOTE — Progress Notes (Signed)
 Subjective:   HPI: Patient is a 68 y.o. male here for right gluteal pain that started about a month ago. The pain is described as sore, with radiation down the lateral aspect of the leg. He also reports numbness and tingling in his toes. Recent lumbar spine X-ray showed mild degenerative joint changes, and an MRI is scheduled for further evaluation. Of note, patient has a history of piriformis syndrome.  He also has a 81-month history of progressively prominent varicose veins on the left side, associated with calf pain. A venous Doppler study is scheduled. The patient denies a history of DVT and is on baby aspirin while wearing compression socks regularly.  Additionally, the patient notes a "knot" on the medial fourth PIP of the left hand, which he first noticed a couple of months ago. It began swelling and becoming tender over the past month. He has tried Voltaren gel for temporary relief. Per patient, recent X-rays revealed arthropathy of the hand joints.    No past medical history on file.  Current Outpatient Medications on File Prior to Visit  Medication Sig Dispense Refill   albuterol (VENTOLIN HFA) 108 (90 Base) MCG/ACT inhaler SMARTSIG:1 Puff(s) By Mouth Every 4 Hours PRN     amoxicillin (AMOXIL) 500 MG capsule Take by mouth.     Calcium 500-2.5 MG-MCG CHEW      Cholecalciferol 100 MCG (4000 UT) CAPS      Continuous Glucose Sensor (DEXCOM G7 SENSOR) MISC      cyanocobalamin (VITAMIN B12) 1000 MCG tablet      diclofenac Sodium (VOLTAREN) 1 % GEL      ezetimibe (ZETIA) 10 MG tablet Take 10 mg by mouth daily.     HUMALOG JUNIOR KWIKPEN 100 UNIT/ML KwikPen Junior Inject into the skin.     LORazepam (ATIVAN) 0.5 MG tablet      metFORMIN (GLUCOPHAGE-XR) 500 MG 24 hr tablet Take 500 mg by mouth 2 (two) times daily.     silodosin (RAPAFLO) 8 MG CAPS capsule TAKE 1 CAPSULE BY MOUTH DAILY 90 capsule 3   TRESIBA FLEXTOUCH 100 UNIT/ML FlexTouch Pen 4 units TID     No current  facility-administered medications on file prior to visit.      Allergies  Allergen Reactions   Hydrocodone Itching   Statins Diarrhea   Morphine Itching and Rash    BP 126/78   Ht 5' 11.5" (1.816 m)   Wt 160 lb (72.6 kg)   BMI 22.00 kg/m       No data to display              No data to display              Objective:  Physical Exam:  Gen: NAD, comfortable in exam room  MSK: - Right Hip: no bruising or swelling. Localized tenderness to palpation over piriformis muscle. ROM is full. Strength 4/5 on hip abduction. Negative FABER and FADIR test.  SLR is neg bilat Neuro testing of strength at foot - 4 motions reveals no weakness - Prominent varicose veins noted on bilateral calves. Not tender to palpation.  - Bouchard nodes in 4th PIP of left hand without redness or swelling. Not tender to palpation.    Neuro: sensation and motor function intact.   Ultrasound of left calf Left calf was visualized all along the medial aspect Of the medial head of the gastroc showed no abnormalities In the soleus muscle there was a complex group of varicose veins that were  enlarged but were easily compressible with no evidence of clotting or occlusion Soleus muscle itself showed no specific damage  Impression significant varicosities in the left soleus muscle region of the calf  Ultrasound and interpretation by Royal Hawthorn B. Fields, MD      Assessment & Plan:  1. Sciatic Nerve Irritation likely due to lumbar degenerative changes, with evidence of L5 radiculopathy. The patient's right gluteal pain, radiating down the lateral leg, along with numbness and tingling in the toes, are consistent with radicular symptoms. Mild degenerative changes on recent lumbar X-ray support this diagnosis. History of piriformis syndrome may be contributing to the current symptoms but is less likely to be the primary cause given the recent radiographic findings.  Plan:  - Start PT - Start gabapentin  -  Incorporate heel lifts  - Proceed with MRI of back as planned   - Follow up in a month   2. Varicose Veins/Calf Pain -prominent varicose veins on the left side, with associated calf pain. Based on ultrasound findings with compressible veins, less likely to be a DVT.   Plan: - Continue using compression socks  - Continue baby aspirin  - Optional to proceed with the scheduled venous Doppler to assess for any underlying venous insufficiency or other concerns.  3. 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 "Knot" in finger likely bouchard nodes.   Plan:  - Continue using Voltaren gel as needed for local relief. - Can consider referral to rheumatology or orthopedic consultation if symptoms worsen.  I observed and examined the patient with the medical student and agree with assessment and plan.  Note reviewed and modified by me.  Sterling Big, MD

## 2024-04-01 NOTE — Assessment & Plan Note (Signed)
 We started the patient on gabapentin 300 3 times daily today We may modify our plan pending results of his MRI that is scheduled  Heel lifts are helpful symptomatically for both his calf pain and his radiculopathy and he will wear these  Return for repeat evaluation after 1 month of the gabapentin

## 2024-04-08 ENCOUNTER — Ambulatory Visit (HOSPITAL_COMMUNITY)
Admission: RE | Admit: 2024-04-08 | Discharge: 2024-04-08 | Disposition: A | Discharge status: Home / Self Care | Source: Ambulatory Visit | Attending: Internal Medicine | Admitting: Internal Medicine

## 2024-04-08 DIAGNOSIS — M4808 Spinal stenosis, sacral and sacrococcygeal region: Secondary | ICD-10-CM | POA: Diagnosis not present

## 2024-04-08 DIAGNOSIS — M48061 Spinal stenosis, lumbar region without neurogenic claudication: Secondary | ICD-10-CM | POA: Diagnosis not present

## 2024-04-08 DIAGNOSIS — M47816 Spondylosis without myelopathy or radiculopathy, lumbar region: Secondary | ICD-10-CM | POA: Diagnosis not present

## 2024-04-08 DIAGNOSIS — M4317 Spondylolisthesis, lumbosacral region: Secondary | ICD-10-CM | POA: Diagnosis not present

## 2024-04-08 DIAGNOSIS — M5441 Lumbago with sciatica, right side: Secondary | ICD-10-CM | POA: Insufficient documentation

## 2024-04-08 DIAGNOSIS — M5442 Lumbago with sciatica, left side: Secondary | ICD-10-CM | POA: Diagnosis not present

## 2024-04-12 ENCOUNTER — Ambulatory Visit: Payer: Self-pay | Admitting: Behavioral Health

## 2024-04-13 ENCOUNTER — Other Ambulatory Visit: Payer: Self-pay | Admitting: Sports Medicine

## 2024-04-13 MED ORDER — GABAPENTIN 300 MG PO CAPS
900.0000 mg | ORAL_CAPSULE | Freq: Three times a day (TID) | ORAL | 2 refills | Status: DC
Start: 2024-04-13 — End: 2024-07-02

## 2024-04-16 DIAGNOSIS — Z1283 Encounter for screening for malignant neoplasm of skin: Secondary | ICD-10-CM | POA: Diagnosis not present

## 2024-04-16 DIAGNOSIS — L720 Epidermal cyst: Secondary | ICD-10-CM | POA: Diagnosis not present

## 2024-04-16 DIAGNOSIS — B351 Tinea unguium: Secondary | ICD-10-CM | POA: Diagnosis not present

## 2024-04-16 DIAGNOSIS — D225 Melanocytic nevi of trunk: Secondary | ICD-10-CM | POA: Diagnosis not present

## 2024-04-19 ENCOUNTER — Encounter: Payer: Self-pay | Admitting: Sports Medicine

## 2024-04-23 NOTE — Progress Notes (Unsigned)
 Referring Physician:  Tena Feeling, MD [address] [place],  [place] [address]  Primary Physician:  Tena Feeling, MD  History of Present Illness: 04/26/2024 [name] is here today with a chief complaint of back and lower extremity pain.  This mostly on the right.  Happened approximately 6 weeks ago while he was lifting a heavy suitcase.  He states that his back and legs have been hurting ever since.  At baseline he is very active.  He can still walk 4 miles and his back slowly will loosen up throughout the day.  If he sitting in 1 position for a long period of time however he does get low back pain radiating down into his legs which does give him some intermittent numbness and tingling.  He is not having any foot drop.  Not having any bowel or bladder dysfunction.  Conservative measures:  Physical therapy: has not participated in PT Multimodal medical therapy including regular antiinflammatories: Tramadol  Injections: no epidural steroid injections  Past Surgery: none  I have utilized the care everywhere function in epic to review the outside records available from external health systems.  Review of Systems:  A 10 point review of systems is negative, except for the pertinent positives and negatives detailed in the HPI.  Past Medical History: History reviewed. No pertinent past medical history.  Past Surgical History: History reviewed. No pertinent surgical history.  Allergies: Allergies as of 04/26/2024 - Review Complete 04/26/2024  Allergen Reaction Noted   Hydrocodone Itching 04/01/2024   Statins Diarrhea 05/09/2006   Morphine Itching and Rash 03/23/2021    Medications:  Current Outpatient Medications:    albuterol (VENTOLIN HFA) 108 (90 Base) MCG/ACT inhaler, SMARTSIG:1 Puff(s) By Mouth Every 4 Hours PRN, Disp: , Rfl:    Calcium 500-2.5 MG-MCG CHEW, , Disp: , Rfl:    Cholecalciferol 100 MCG (4000 UT) CAPS, , Disp: , Rfl:    Continuous  Glucose Sensor (DEXCOM G7 SENSOR) MISC, , Disp: , Rfl:    cyanocobalamin (VITAMIN B12) 1000 MCG tablet, , Disp: , Rfl:    diclofenac Sodium (VOLTAREN) 1 % GEL, , Disp: , Rfl:    ezetimibe (ZETIA) 10 MG tablet, Take 10 mg by mouth daily., Disp: , Rfl:    gabapentin  (NEURONTIN ) 300 MG capsule, Take 3 capsules (900 mg total) by mouth 3 (three) times daily., Disp: 270 capsule, Rfl: 2   HUMALOG JUNIOR KWIKPEN 100 UNIT/ML KwikPen Junior, Inject into the skin., Disp: , Rfl:    LORazepam (ATIVAN) 0.5 MG tablet, , Disp: , Rfl:    metFORMIN (GLUCOPHAGE-XR) 500 MG 24 hr tablet, Take 500 mg by mouth 2 (two) times daily., Disp: , Rfl:    silodosin (RAPAFLO) 8 MG CAPS capsule, TAKE 1 CAPSULE BY MOUTH DAILY, Disp: 90 capsule, Rfl: 3   traMADol (ULTRAM) 50 MG tablet, Take 50 mg by mouth 2 (two) times daily as needed., Disp: , Rfl:    TRESIBA FLEXTOUCH 100 UNIT/ML FlexTouch Pen, 4 units TID, Disp: , Rfl:   Social History: Social History   Tobacco Use   Smoking status: Never   Smokeless tobacco: Never  Vaping Use   Vaping status: Never Used    Family Medical History: History reviewed. No pertinent family history.  Physical Examination: Vitals:   04/26/24 1007  BP: (!) 170/76    General: Patient is in no apparent distress. Attention to examination is appropriate.  Neck:   Supple.  Full range of motion.  Respiratory: Patient is breathing without any difficulty.   NEUROLOGICAL:     Awake, alert, oriented to person, place, and time.  Speech is clear and fluent.   Strength:  Side Iliopsoas Quads Hamstring PF DF EHL  R 5 5 5 5 5 5   L 5 5 5 5 5 5    Reflexes are 2+ and symmetric at the biceps, triceps, brachioradialis, patella and achilles, with the exception of some very mild decrease in his right medial hamstrings reflex and right Achilles reflex.  Bilateral upper and lower extremity sensation is intact to light touch at rest and when he is asymptomatic     No evidence of dysmetria  noted.  Gait is normal.    Imaging: Narrative & Impression  CLINICAL DATA:  68 year old male with acute back pain, sciatica, left side symptoms.   EXAM: MRI LUMBAR SPINE WITHOUT CONTRAST   TECHNIQUE: Multiplanar, multisequence MR imaging of the lumbar spine was performed. No intravenous contrast was administered.   COMPARISON:  Lumbar radiographs 03/22/2024. CT Abdomen and Pelvis 11/23/2020.   FINDINGS: Segmentation: Designating the lowest ribs as T12 on the comparisons results in normal lumbar segmentation with a partially lumbarized S1-S2 level, vestigial disc space there. Correlation with radiographs is recommended prior to any operative intervention.   Alignment: Mild straightening of lumbar lordosis stable from the 2021 CT. Subtle retrolisthesis of L4 on L5 and mild grade 1 anterolisthesis of L5-S1 also not significantly changed since 2021. No significant scoliosis.   Vertebrae: Normal background bone marrow signal. Maintained vertebral body height. Intact visible sacrum and SI joints.   Confluent degenerative appearing marrow edema in the right side L5 and S1 facets, posterior elements (series 6, image 5). Underlying chronic facet arthropathy, see additional details of those levels below.   No other marrow edema or acute osseous abnormality.   Conus medullaris and cauda equina: Conus partially visible at T12-L1, appears negative. Generally capacious lumbar spinal canal and normal cauda equina nerve roots.   Paraspinal and other soft tissues: Negative.   Disc levels:   T12-L1:  Negative.   L1-L2:  Negative.   L2-L3:  Negative.   L3-L4: Mild disc desiccation. Subtle disc bulging. Mild facet and ligament flavum hypertrophy.   L4-L5: Mild disc desiccation. Circumferential disc bulge. Mild to moderate facet and ligament flavum hypertrophy. Trace subligamentous synovial cysts suspected on series 8, image 24, not contributing to stenosis. Broad-based  asymmetric left foraminal disc (same image). Borderline to mild spinal stenosis. No convincing lateral recess stenosis. Mild to moderate left and mild right L4 neural foraminal stenosis.   L5-S1: Mild grade 1 anterolisthesis. Disc desiccation. Circumferential disc/pseudo disc. Mild endplate spurring, but fairly severe facet and ligament flavum hypertrophy. Degenerative facet joint fluid greater on the left (series 8, image 29). Facet and pedicle marrow edema confluent on the right as stated above. Posteriorly situated synovial cysts left greater than right (series 8, image 32) which should not cause neural compromise. And additional larger up to 7 mm diameter right side synovial cyst of the L5 facet best seen on series 6, image 4. This also does not appear to be contributing to neural impingement at this level.   Mild to moderate spinal stenosis (series 8, image 28) with moderate bilateral lateral recess stenosis (S1 nerve levels). Mild bilateral L5 neural foraminal stenosis.   S1-S2: Vestigial disc space.  Mild facet hypertrophy.  No stenosis.   IMPRESSION: 1. Mildly transitional anatomy with vestigial S1-S2 disc space. Lowest ribs are it T12. Correlation with  radiographs is recommended prior to any operative intervention.   2. 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 Confluent marrow edema of the right L5 and S1 posterior elements appears to be Acute Exacerbation of the Facet Arthritis. 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 Overall Moderate multifactorial spinal and bilateral lateral recess stenosis (S1 nerve levels) with mild L5 foraminal stenosis.   3. Lesser degeneration at L4-L5 with borderline to mild spinal stenosis, up to moderate left and mild right L4 neural foraminal stenosis.     Electronically Signed   By: Marlise Simpers M.D.   On: 04/14/2024 05:49    I have personally reviewed the images and agree with the above interpretation.  When I reviewed the imaging the most significant finding was the T2 signal in the joint capsules and joint space with some slight splaying which could indicate instability, for this reason I ordered flexion-extension x-rays and are awaiting the results.  Medical Decision Making/Assessment and Plan: [name] is a pleasant 68 y.o. male with a relatively acute onset of back and right lower extremity pain after living a hefty object.  States that he has had back pain radiating down into his right lower extremity and right buttocks ever since approximately 6 weeks ago.  He states that sometimes at rest he will not have any issues however in certain positions if he sits long enough he will get numbness and tingling going down his right lower extremity as well as pain in his back and buttocks.  He is able to still continue to walk up to 4 miles as he is quite active and healthy otherwise.  On his physical examination he has good strength but does have some mild decrease in his right sided low lumbar reflexes including his Achilles and medial hamstrings.  This is likely indicative of a mild radiculopathy, he has not yet been able to start steroids as he has a history of significant diabetes, he is seeing pain team for possible spinal injections, he has had a history of previous piriformis injections for sciatica secondary to a calcified bursa.  At this point I would recommend conservative management including physical therapy, injection therapy, and follow-up in clinic.  I would like to get a flexion-extension film of his lumbar spine to evaluate for any mobile spondylolisthesis as he does have some evidence of T2 signal within his joint line which can sometimes indicate spinal instability.  Thank you for involving me in the care of this patient.    Carroll Clamp MD/MSCR Neurosurgery

## 2024-04-26 ENCOUNTER — Ambulatory Visit
Admission: RE | Admit: 2024-04-26 | Discharge: 2024-04-26 | Disposition: A | Discharge status: Home / Self Care | Attending: Neurosurgery | Admitting: Neurosurgery

## 2024-04-26 ENCOUNTER — Ambulatory Visit: Admitting: Neurosurgery

## 2024-04-26 ENCOUNTER — Encounter: Payer: Self-pay | Admitting: Neurosurgery

## 2024-04-26 ENCOUNTER — Ambulatory Visit
Admission: RE | Admit: 2024-04-26 | Discharge: 2024-04-26 | Disposition: A | Discharge status: Home / Self Care | Source: Ambulatory Visit | Attending: Neurosurgery | Admitting: Neurosurgery

## 2024-04-26 VITALS — BP 170/76 | Ht 71.5 in | Wt 168.0 lb

## 2024-04-26 DIAGNOSIS — M5416 Radiculopathy, lumbar region: Secondary | ICD-10-CM

## 2024-04-26 DIAGNOSIS — M5136 Other intervertebral disc degeneration, lumbar region with discogenic back pain only: Secondary | ICD-10-CM | POA: Diagnosis not present

## 2024-04-26 DIAGNOSIS — M4307 Spondylolysis, lumbosacral region: Secondary | ICD-10-CM | POA: Diagnosis not present

## 2024-04-27 ENCOUNTER — Other Ambulatory Visit: Payer: Self-pay

## 2024-04-27 DIAGNOSIS — M5416 Radiculopathy, lumbar region: Secondary | ICD-10-CM

## 2024-04-29 ENCOUNTER — Ambulatory Visit: Admitting: Family Medicine

## 2024-05-03 DIAGNOSIS — M545 Low back pain, unspecified: Secondary | ICD-10-CM | POA: Diagnosis not present

## 2024-05-04 DIAGNOSIS — M48062 Spinal stenosis, lumbar region with neurogenic claudication: Secondary | ICD-10-CM | POA: Diagnosis not present

## 2024-05-06 ENCOUNTER — Ambulatory Visit: Payer: Self-pay | Admitting: Neurosurgery

## 2024-05-06 DIAGNOSIS — M48062 Spinal stenosis, lumbar region with neurogenic claudication: Secondary | ICD-10-CM | POA: Diagnosis not present

## 2024-05-07 DIAGNOSIS — M5416 Radiculopathy, lumbar region: Secondary | ICD-10-CM | POA: Diagnosis not present

## 2024-05-10 DIAGNOSIS — M48062 Spinal stenosis, lumbar region with neurogenic claudication: Secondary | ICD-10-CM | POA: Diagnosis not present

## 2024-05-14 ENCOUNTER — Other Ambulatory Visit: Payer: Self-pay | Admitting: *Deleted

## 2024-05-14 DIAGNOSIS — I83892 Varicose veins of left lower extremities with other complications: Secondary | ICD-10-CM

## 2024-06-01 ENCOUNTER — Encounter (HOSPITAL_COMMUNITY)

## 2024-06-01 ENCOUNTER — Encounter

## 2024-06-03 DIAGNOSIS — M48062 Spinal stenosis, lumbar region with neurogenic claudication: Secondary | ICD-10-CM | POA: Diagnosis not present

## 2024-06-07 DIAGNOSIS — M5416 Radiculopathy, lumbar region: Secondary | ICD-10-CM | POA: Diagnosis not present

## 2024-06-10 DIAGNOSIS — G629 Polyneuropathy, unspecified: Secondary | ICD-10-CM | POA: Diagnosis not present

## 2024-06-10 DIAGNOSIS — E1165 Type 2 diabetes mellitus with hyperglycemia: Secondary | ICD-10-CM | POA: Diagnosis not present

## 2024-06-10 DIAGNOSIS — E7800 Pure hypercholesterolemia, unspecified: Secondary | ICD-10-CM | POA: Diagnosis not present

## 2024-06-10 DIAGNOSIS — R3129 Other microscopic hematuria: Secondary | ICD-10-CM | POA: Diagnosis not present

## 2024-06-11 DIAGNOSIS — M48062 Spinal stenosis, lumbar region with neurogenic claudication: Secondary | ICD-10-CM | POA: Diagnosis not present

## 2024-06-17 DIAGNOSIS — E559 Vitamin D deficiency, unspecified: Secondary | ICD-10-CM | POA: Diagnosis not present

## 2024-06-17 DIAGNOSIS — E7800 Pure hypercholesterolemia, unspecified: Secondary | ICD-10-CM | POA: Diagnosis not present

## 2024-06-17 DIAGNOSIS — G629 Polyneuropathy, unspecified: Secondary | ICD-10-CM | POA: Diagnosis not present

## 2024-06-17 DIAGNOSIS — E1165 Type 2 diabetes mellitus with hyperglycemia: Secondary | ICD-10-CM | POA: Diagnosis not present

## 2024-06-18 DIAGNOSIS — M48062 Spinal stenosis, lumbar region with neurogenic claudication: Secondary | ICD-10-CM | POA: Diagnosis not present

## 2024-06-22 ENCOUNTER — Ambulatory Visit: Admitting: Sports Medicine

## 2024-06-22 VITALS — BP 126/80 | Ht 72.0 in | Wt 166.0 lb

## 2024-06-22 DIAGNOSIS — M5416 Radiculopathy, lumbar region: Secondary | ICD-10-CM | POA: Diagnosis not present

## 2024-06-22 NOTE — Progress Notes (Signed)
 Chief complaint follow-up of low back pain with spondylolisthesis and radicular symptoms  Patient has now been referred to Dr. Ibazebo and underwent spinal injections.  These gave him about 10 days of excellent relief but then the symptoms returned.  He was traveling in [place] and took a course of prednisone once the symptoms recurred and again got good relief.  Now 2 weeks after having finished the prednisone he is back to significant pain and limitations.  He gets up in the morning uses a lot of heat on his low back from the shower but it takes him more than an hour to be able to stand up straight.  At times he feels that he cannot even walk to the bathroom.  He is using tramadol when he gets significant pain he gets some relief with that.  He is on gabapentin  3 times a day but not sure that this is offering him much benefit.  Physical therapy has given him an extra set of flexion exercises which helped somewhat and he is continue to do those at home.  He has also seen neurosurgery.  They did not feel that he has a surgical condition after checking flexion and extension views and wanted him to continue conservative care.  Physical exam Patient appears uncomfortable and with sitting standing or walking the back appears very stiff BP 126/80   Ht 6' (1.829 m)   Wt 166 lb (75.3 kg)   BMI 22.51 kg/m   Pain appears to be relieved somewhat with flexion of the lumbar spine Pain seems worsening with standing straight and clearly worsened with extension Walking is uncomfortable but does not seem to be weak on one side versus the other

## 2024-06-22 NOTE — Assessment & Plan Note (Signed)
 Further assessment has indicated that his radiculopathy is likely referred from changes in his lumbar spine including spondylolisthesis Currently his medications regimen seems to be maximized  I suggested in addition to the exercise program he is on that he had more crunches and demonstrated a series of these for him to try Continued to use the tramadol intermittently for pain flares but perhaps would benefit from use that twice every day Continue the gabapentin   We will try a lumbar support to see if this offers him any symptomatic benefit We could try ESWT since nothing seems to be helping  He is concerned with needing to see pain management if he does not get relief with some of these measures I will try to see him back in the short-term to give him a trial of ESWT

## 2024-06-24 DIAGNOSIS — M47816 Spondylosis without myelopathy or radiculopathy, lumbar region: Secondary | ICD-10-CM | POA: Diagnosis not present

## 2024-06-24 DIAGNOSIS — M48062 Spinal stenosis, lumbar region with neurogenic claudication: Secondary | ICD-10-CM | POA: Diagnosis not present

## 2024-06-28 ENCOUNTER — Ambulatory Visit: Admitting: Neurosurgery

## 2024-06-28 VITALS — BP 136/82 | Ht 71.5 in | Wt 169.5 lb

## 2024-06-28 DIAGNOSIS — M47816 Spondylosis without myelopathy or radiculopathy, lumbar region: Secondary | ICD-10-CM | POA: Diagnosis not present

## 2024-06-28 DIAGNOSIS — M47897 Other spondylosis, lumbosacral region: Secondary | ICD-10-CM | POA: Diagnosis not present

## 2024-06-28 DIAGNOSIS — M79604 Pain in right leg: Secondary | ICD-10-CM | POA: Diagnosis not present

## 2024-06-28 DIAGNOSIS — M5416 Radiculopathy, lumbar region: Secondary | ICD-10-CM

## 2024-06-28 DIAGNOSIS — M48062 Spinal stenosis, lumbar region with neurogenic claudication: Secondary | ICD-10-CM | POA: Diagnosis not present

## 2024-06-28 NOTE — Progress Notes (Signed)
 Referring Physician:  Dwight Trula SQUIBB, MD [address] [place],  [place] [address]  Primary Physician:  Dwight Trula SQUIBB, MD  History of Present Illness: 06/28/2024 Ethan Booth is here today with a chief complaint of back and lower extremity pain.  Majority of his symptoms in fact almost all of his symptoms are right lower extremity.  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  He had severe right lower extremity pain and this pain incredibly debilitating.  He will have some moments where he can have relief, especially if he is leaning forward on an object in order to help him ambulate.  He is not having any bowel or bladder dysfunction.  He states that the pain has become so severe that he is often had to crawl on the floor to make it to the restroom.  This is a major change from his previously super active lifestyle.  He would often run multiple miles per day  Conservative measures:  Physical therapy: Has been working with physical therapy for the past 2 months.  Recently started with a new therapist. Multimodal medical therapy including regular antiinflammatories: Tramadol  Injections: He did have some improvement for approximately 1.5 weeks with a epidural steroid injection.  Past Surgery: none  I have utilized the care everywhere function in epic to review the outside records available from external health systems.  Review of Systems:  A 10 point review of systems is negative, except for the pertinent positives and negatives detailed in the HPI.  Past Medical History: No past medical history on file.  Past Surgical History: No past surgical history on file.  Allergies: Allergies as of 06/28/2024 - Review Complete 06/28/2024  Allergen Reaction Noted   Hydrocodone Itching 04/01/2024   Statins Diarrhea 05/09/2006   Morphine Itching and Rash 03/23/2021    Medications:  Current Outpatient Medications:    albuterol (VENTOLIN HFA) 108 (90 Base)  MCG/ACT inhaler, SMARTSIG:1 Puff(s) By Mouth Every 4 Hours PRN, Disp: , Rfl:    Calcium 500-2.5 MG-MCG CHEW, , Disp: , Rfl:    Cholecalciferol 100 MCG (4000 UT) CAPS, , Disp: , Rfl:    Continuous Glucose Sensor (DEXCOM G7 SENSOR) MISC, , Disp: , Rfl:    cyanocobalamin (VITAMIN B12) 1000 MCG tablet, , Disp: , Rfl:    diclofenac Sodium (VOLTAREN) 1 % GEL, , Disp: , Rfl:    ezetimibe (ZETIA) 10 MG tablet, Take 10 mg by mouth daily., Disp: , Rfl:    gabapentin  (NEURONTIN ) 300 MG capsule, Take 3 capsules (900 mg total) by mouth 3 (three) times daily., Disp: 270 capsule, Rfl: 2   HUMALOG JUNIOR KWIKPEN 100 UNIT/ML KwikPen Junior, Inject into the skin. 5-6 units pre meals, Disp: , Rfl:    LORazepam (ATIVAN) 0.5 MG tablet, , Disp: , Rfl:    metFORMIN (GLUCOPHAGE-XR) 500 MG 24 hr tablet, Take 500 mg by mouth 2 (two) times daily., Disp: , Rfl:    silodosin (RAPAFLO) 8 MG CAPS capsule, TAKE 1 CAPSULE BY MOUTH DAILY, Disp: 90 capsule, Rfl: 3   traMADol (ULTRAM) 50 MG tablet, Take 50 mg by mouth 2 (two) times daily as needed., Disp: , Rfl:    TRESIBA FLEXTOUCH 100 UNIT/ML FlexTouch Pen, 5 units at bedtime, Disp: , Rfl:   Social History: Social History   Tobacco Use   Smoking status: Never   Smokeless tobacco: Never  Vaping Use   Vaping status: Never Used    Family Medical History:  No family history on file.  Physical Examination: Vitals:   06/28/24 1037  BP: 136/82    General: Patient is in no apparent distress. Attention to examination is appropriate.  Neck:   Supple.  Full range of motion.  Respiratory: Patient is breathing without any difficulty.   NEUROLOGICAL:     Awake, alert, oriented to person, place, and time.  Speech is clear and fluent.   Strength:  Side Iliopsoas Quads Hamstring PF DF EHL  R 5 5 5 5 5 5   L 5 5 5 5 5 5    Decreased right medial hamstrings reflex and right Achilles reflex.  Bilateral upper and lower extremity sensation is intact to light touch at rest  and when he is asymptomatic     No evidence of dysmetria noted.  Antalgic  Imaging: Narrative & Impression  CLINICAL DATA:  68 year old male with acute back pain, sciatica, left side symptoms.   EXAM: MRI LUMBAR SPINE WITHOUT CONTRAST   TECHNIQUE: Multiplanar, multisequence MR imaging of the lumbar spine was performed. No intravenous contrast was administered.   COMPARISON:  Lumbar radiographs 03/22/2024. CT Abdomen and Pelvis 11/23/2020.   FINDINGS: Segmentation: Designating the lowest ribs as T12 on the comparisons results in normal lumbar segmentation with a partially lumbarized S1-S2 level, vestigial disc space there. Correlation with radiographs is recommended prior to any operative intervention.   Alignment: Mild straightening of lumbar lordosis stable from the 2021 CT. Subtle retrolisthesis of L4 on L5 and mild grade 1 anterolisthesis of L5-S1 also not significantly changed since 2021. No significant scoliosis.   Vertebrae: Normal background bone marrow signal. Maintained vertebral body height. Intact visible sacrum and SI joints.   Confluent degenerative appearing marrow edema in the right side L5 and S1 facets, posterior elements (series 6, image 5). Underlying chronic facet arthropathy, see additional details of those levels below.   No other marrow edema or acute osseous abnormality.   Conus medullaris and cauda equina: Conus partially visible at T12-L1, appears negative. Generally capacious lumbar spinal canal and normal cauda equina nerve roots.   Paraspinal and other soft tissues: Negative.   Disc levels:   T12-L1:  Negative.   L1-L2:  Negative.   L2-L3:  Negative.   L3-L4: Mild disc desiccation. Subtle disc bulging. Mild facet and ligament flavum hypertrophy.   L4-L5: Mild disc desiccation. Circumferential disc bulge. Mild to moderate facet and ligament flavum hypertrophy. Trace subligamentous synovial cysts suspected on series 8, image 24,  not contributing to stenosis. Broad-based asymmetric left foraminal disc (same image). Borderline to mild spinal stenosis. No convincing lateral recess stenosis. Mild to moderate left and mild right L4 neural foraminal stenosis.   L5-S1: Mild grade 1 anterolisthesis. Disc desiccation. Circumferential disc/pseudo disc. Mild endplate spurring, but fairly severe facet and ligament flavum hypertrophy. Degenerative facet joint fluid greater on the left (series 8, image 29). Facet and pedicle marrow edema confluent on the right as stated above. Posteriorly situated synovial cysts left greater than right (series 8, image 32) which should not cause neural compromise. And additional larger up to 7 mm diameter right side synovial cyst of the L5 facet best seen on series 6, image 4. This also does not appear to be contributing to neural impingement at this level.   Mild to moderate spinal stenosis (series 8, image 28) with moderate bilateral lateral recess stenosis (S1 nerve levels). Mild bilateral L5 neural foraminal stenosis.   S1-S2: Vestigial disc space.  Mild facet hypertrophy.  No stenosis.   IMPRESSION:  1. Mildly transitional anatomy with vestigial S1-S2 disc space. Lowest ribs are it T12. Correlation with radiographs is recommended prior to any operative intervention.   2. 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 Confluent marrow edema of the right L5 and S1 posterior elements appears to be Acute Exacerbation of the Facet Arthritis. 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 Overall Moderate multifactorial spinal and bilateral lateral recess stenosis (S1 nerve levels) with mild L5 foraminal stenosis.   3. Lesser degeneration at L4-L5 with borderline to mild spinal stenosis, up to moderate left and mild right L4 neural foraminal stenosis.     Electronically Signed    By: VEAR Hurst M.D.   On: 04/14/2024 05:49   I have personally reviewed the images and agree with the above interpretation.  When I reviewed the imaging the most significant finding was the T2 signal in the joint capsules and joint space with some slight splaying which could indicate instability, for this reason I ordered flexion-extension x-rays and are awaiting the results.  Medical Decision Making/Assessment and Plan: Ethan Booth is a pleasant 68 y.o. male with a relatively acute onset of back and right lower extremity pain after living a hefty object.  He has now left with considerable right lower extremity debilitating pain.  States that his back pain is quite minimal at this time.  He has had a lumbar injection as well as 2 months of physical therapy.  Continues to have debilitating pain.  He often find himself stuck on the floor and have to crawl to the restroom.  This is been a progressive worsening since he was seen last.  He has started working with a new physical therapist which on the day of treatments is able to get him some significant relief.  However he continues to have considerable radicular symptoms.  He often leans his upper body over to the left to help decompress his nerve pain.  He is also started some electrotherapy.  Given his ongoing compression, he does have transitional vertebrae but at the listed L5-S1 levels on the right he has compression secondary to ligamentum flavum and facet are from arthropathy causing compression of the lateral recess.  Does not appear to be hypermobile.  We did discuss that since he has been failing conservative therapy that he would qualify for a decompression.  He would like to try a few more weeks of physical therapy prior to surgical decompression.  Will plan on seeing him back in approximately 6 weeks, should he have any progressive worsening in that time he was instructed to call back for advanced planning.  Thank you for involving me in the care of this  patient.    Penne MICAEL Sharps MD/MSCR Neurosurgery   Spent a total of 30 minutes with this patient today, reviewing his imaging, going over his updated history and physical.  Discussing plan going forward, coordinating his care, and documentation.

## 2024-06-29 ENCOUNTER — Ambulatory Visit (INDEPENDENT_AMBULATORY_CARE_PROVIDER_SITE_OTHER): Payer: Self-pay | Admitting: Sports Medicine

## 2024-06-29 ENCOUNTER — Telehealth: Payer: Self-pay | Admitting: Neurosurgery

## 2024-06-29 DIAGNOSIS — M5416 Radiculopathy, lumbar region: Secondary | ICD-10-CM

## 2024-06-29 NOTE — Telephone Encounter (Addendum)
 Noted. He would have to have any dental infection cleared prior to surgery.  I have a posting sheet from Dr Claudene that he filled out for if/when he fails PT. I have placed this sheet in my bottom drawer.

## 2024-06-29 NOTE — Telephone Encounter (Signed)
 Patient left a voicemail and wants to hold off on scheduling surgery for now. He states that he is having some periodontal issues and wants to be sure any infection is cleared before having surgery.

## 2024-06-29 NOTE — Assessment & Plan Note (Signed)
 A trial on ESWT today I am unsure whether this will help but told him to monitor his symptoms over the next 2 days if If he is improved it may be worth trying 4-6 treatments

## 2024-06-29 NOTE — Progress Notes (Signed)
 Chief complaint severe low back pain  Patient is being evaluated by neurosurgery myself and physical therapy for right-sided sciatica and severe low back pain that is causing him to be significantly disabled.  Because of this we tried an episode of ESWT today to see if it gives him some soft tissue release in his low back  ESWT #1 Anatomical area right side low back lateral to the SI joint into the hip Head size large Power level 60 mJ Frequency 10 Shock impulses 2000  Patient had pain anytime he got near his SI joint that would radiate into his right leg He will monitor to see if he gets improvement in the back symptoms over the next couple days and if so we will try a series of 4-6 sessions to see if it helps him.  I did advise him that I thought the microsurgery was probably a reasonable option to see if he can free up the nerve root that is causing him so much difficulty.

## 2024-07-02 ENCOUNTER — Other Ambulatory Visit: Payer: Self-pay | Admitting: Sports Medicine

## 2024-07-02 DIAGNOSIS — M47816 Spondylosis without myelopathy or radiculopathy, lumbar region: Secondary | ICD-10-CM | POA: Diagnosis not present

## 2024-07-02 DIAGNOSIS — M48062 Spinal stenosis, lumbar region with neurogenic claudication: Secondary | ICD-10-CM | POA: Diagnosis not present

## 2024-07-06 ENCOUNTER — Ambulatory Visit (INDEPENDENT_AMBULATORY_CARE_PROVIDER_SITE_OTHER): Payer: Self-pay | Admitting: Sports Medicine

## 2024-07-06 DIAGNOSIS — M5416 Radiculopathy, lumbar region: Secondary | ICD-10-CM

## 2024-07-06 NOTE — Assessment & Plan Note (Signed)
 ESWT focused on muscles in his upper buttocks seems to be helping. Formal treatment #1 today and will plan 4-6 to see if he continues getting benefit.

## 2024-07-06 NOTE — Progress Notes (Signed)
 ESWT #1  Patient had a trial with ESWT to his right SI and buttocks area and this seemed to help.  We used fairly low level at the time but he is now walking better and has improved symptoms in spite of a known spondylolisthesis and neurogenic symptoms.  Power level 90 mJ Impulses 2500 Head size large Location from right SI joint down into upper buttocks Frequency 16  Patient tolerated the treatment well and actually felt some pain relief after receiving this.  He plans to try a series of ESWT treatments to see if it might help him recover from his radicular back issues.

## 2024-07-09 DIAGNOSIS — M47816 Spondylosis without myelopathy or radiculopathy, lumbar region: Secondary | ICD-10-CM | POA: Diagnosis not present

## 2024-07-09 DIAGNOSIS — M48062 Spinal stenosis, lumbar region with neurogenic claudication: Secondary | ICD-10-CM | POA: Diagnosis not present

## 2024-07-12 DIAGNOSIS — E7800 Pure hypercholesterolemia, unspecified: Secondary | ICD-10-CM | POA: Diagnosis not present

## 2024-07-12 DIAGNOSIS — R3129 Other microscopic hematuria: Secondary | ICD-10-CM | POA: Diagnosis not present

## 2024-07-12 DIAGNOSIS — Z4681 Encounter for fitting and adjustment of insulin pump: Secondary | ICD-10-CM | POA: Diagnosis not present

## 2024-07-12 DIAGNOSIS — E559 Vitamin D deficiency, unspecified: Secondary | ICD-10-CM | POA: Diagnosis not present

## 2024-07-12 DIAGNOSIS — G629 Polyneuropathy, unspecified: Secondary | ICD-10-CM | POA: Diagnosis not present

## 2024-07-12 DIAGNOSIS — E1165 Type 2 diabetes mellitus with hyperglycemia: Secondary | ICD-10-CM | POA: Diagnosis not present

## 2024-07-13 ENCOUNTER — Ambulatory Visit (INDEPENDENT_AMBULATORY_CARE_PROVIDER_SITE_OTHER): Payer: Self-pay | Admitting: Sports Medicine

## 2024-07-13 DIAGNOSIS — M5416 Radiculopathy, lumbar region: Secondary | ICD-10-CM

## 2024-07-13 NOTE — Assessment & Plan Note (Signed)
 ESWT 2 of planned # 6 today Some immediate improvement in calf pain sxs Repeat 1 week

## 2024-07-13 NOTE — Progress Notes (Signed)
   ESWT #2   Patient had a trial with ESWT to his right SI and buttocks area and this seemed to help.  he is now walking better and has improved symptoms in spite of a known spondylolisthesis and neurogenic symptoms. PT with Norleen Arthur really helps   Power level 90 mJ Impulses 2500 Head size large Location from right SI joint down into upper buttocks Frequency 16      Repeat 1 week

## 2024-07-14 DIAGNOSIS — E119 Type 2 diabetes mellitus without complications: Secondary | ICD-10-CM | POA: Diagnosis not present

## 2024-07-14 DIAGNOSIS — M5416 Radiculopathy, lumbar region: Secondary | ICD-10-CM | POA: Diagnosis not present

## 2024-07-14 DIAGNOSIS — Z0000 Encounter for general adult medical examination without abnormal findings: Secondary | ICD-10-CM | POA: Diagnosis not present

## 2024-07-14 DIAGNOSIS — I83892 Varicose veins of left lower extremities with other complications: Secondary | ICD-10-CM | POA: Diagnosis not present

## 2024-07-14 DIAGNOSIS — R9389 Abnormal findings on diagnostic imaging of other specified body structures: Secondary | ICD-10-CM | POA: Diagnosis not present

## 2024-07-14 DIAGNOSIS — M48062 Spinal stenosis, lumbar region with neurogenic claudication: Secondary | ICD-10-CM | POA: Diagnosis not present

## 2024-07-15 ENCOUNTER — Encounter: Payer: Self-pay | Admitting: Physician Assistant

## 2024-07-15 ENCOUNTER — Ambulatory Visit: Attending: Physician Assistant | Admitting: Physician Assistant

## 2024-07-15 ENCOUNTER — Ambulatory Visit (HOSPITAL_COMMUNITY)
Admission: RE | Admit: 2024-07-15 | Discharge: 2024-07-15 | Disposition: A | Discharge status: Home / Self Care | Payer: Self-pay | Source: Ambulatory Visit | Attending: Physician Assistant | Admitting: Physician Assistant

## 2024-07-15 VITALS — BP 123/79 | HR 90 | Temp 98.0°F | Wt 166.0 lb

## 2024-07-15 DIAGNOSIS — E1165 Type 2 diabetes mellitus with hyperglycemia: Secondary | ICD-10-CM | POA: Diagnosis not present

## 2024-07-15 DIAGNOSIS — M47816 Spondylosis without myelopathy or radiculopathy, lumbar region: Secondary | ICD-10-CM | POA: Diagnosis not present

## 2024-07-15 DIAGNOSIS — M48062 Spinal stenosis, lumbar region with neurogenic claudication: Secondary | ICD-10-CM | POA: Diagnosis not present

## 2024-07-15 DIAGNOSIS — I83892 Varicose veins of left lower extremities with other complications: Secondary | ICD-10-CM

## 2024-07-15 NOTE — Progress Notes (Signed)
 [facility]           [facility] [place]  History and Physical   Ethan Booth is a 68 y.o. male who presents with varicose veins in the left leg.    He states he was sent for evaluation after he tweaked his back and was having left leg pain.  He had MRI that revealed L5-S1 disc issues.  He was sent here for completeness on his workup.    He states that he gets some achiness in the left leg when he has been standing for a period of time.  He states he gets some puffiness around the lower medial ankle as the day progresses but improves after being in bed overnight.  He does not have hx of DVT.  He does have family hx of varicose veins.  He states he has been wearing knee high 20-30 mmHg compression socks, which have helped.    He states that prior to hurting his back, he was cycling every day and going to the gym. He does swim daily as well.    The pt is not on a statin for cholesterol management.  The pt is not on a daily aspirin.   Other AC:  none The pt is not on medication for hypertension.   The pt is on medication for diabetes.   Tobacco hx:  never  Pt does have very distant uncle with family hx of AAA.  PMH: Lumbar radiculopathy DDD BPH GERD Spinal stenosis Varicose veins Diabetes  Social History   Socioeconomic History   Marital status: Married    Spouse name: Not on file   Number of children: Not on file   Years of education: Not on file   Highest education level: Not on file  Occupational History   Not on file  Tobacco Use   Smoking status: Never   Smokeless tobacco: Never  Vaping Use   Vaping status: Never Used  Substance and Sexual Activity   Alcohol use: Not on file   Drug use: Not on file   Sexual activity: Not on file  Other Topics Concern   Not on file  Social History Narrative   Not on file   Social Drivers of Health   Financial Resource Strain: Not on file  Food Insecurity: Not on file  Transportation Needs: Not on  file  Physical Activity: Not on file  Stress: Not on file  Social Connections: Not on file  Intimate Partner Violence: Not on file    FH + varicose veins; distant relative with AAA  Current Outpatient Medications  Medication Sig Dispense Refill   albuterol (VENTOLIN HFA) 108 (90 Base) MCG/ACT inhaler SMARTSIG:1 Puff(s) By Mouth Every 4 Hours PRN     Calcium 500-2.5 MG-MCG CHEW      Cholecalciferol 100 MCG (4000 UT) CAPS      Continuous Glucose Sensor (DEXCOM G7 SENSOR) MISC      cyanocobalamin (VITAMIN B12) 1000 MCG tablet      diclofenac Sodium (VOLTAREN) 1 % GEL      ezetimibe (ZETIA) 10 MG tablet Take 10 mg by mouth daily.     gabapentin  (NEURONTIN ) 300 MG capsule TAKE 3 CAPSULES(900 MG) BY MOUTH THREE TIMES DAILY 270 capsule 2   HUMALOG JUNIOR KWIKPEN 100 UNIT/ML KwikPen Junior Inject into the skin. 5-6 units pre meals     LORazepam (ATIVAN) 0.5 MG tablet      metFORMIN (GLUCOPHAGE-XR) 500 MG  24 hr tablet Take 500 mg by mouth 2 (two) times daily.     silodosin (RAPAFLO) 8 MG CAPS capsule TAKE 1 CAPSULE BY MOUTH DAILY 90 capsule 3   traMADol (ULTRAM) 50 MG tablet Take 50 mg by mouth 2 (two) times daily as needed.     TRESIBA FLEXTOUCH 100 UNIT/ML FlexTouch Pen 5 units at bedtime     No current facility-administered medications for this visit.    Allergies  Allergen Reactions   Hydrocodone Itching   Statins Diarrhea   Morphine Itching and Rash    REVIEW OF SYSTEMS:   [X]  denotes positive finding, [ ]  denotes negative finding Cardiac  Comments:  Chest pain or chest pressure:    Shortness of breath upon exertion:    Short of breath when lying flat:    Irregular heart rhythm:        Vascular    Pain in calf, thigh, or hip brought on by ambulation:    Pain in feet at night that wakes you up from your sleep:     Blood clot in your veins:    Leg swelling:  x       Pulmonary    Oxygen at home:    Productive cough:     Wheezing:         Neurologic    Sudden weakness  in arms or legs:     Sudden numbness in arms or legs:     Sudden onset of difficulty speaking or slurred speech:    Temporary loss of vision in one eye:     Problems with dizziness:         Gastrointestinal    Blood in stool:     Vomited blood:         Genitourinary    Burning when urinating:     Blood in urine:        Psychiatric    Major depression:         Hematologic    Bleeding problems:    Problems with blood clotting too easily:        Skin    Rashes or ulcers:        Constitutional    Fever or chills:      PHYSICAL EXAMINATION:  Today's Vitals   07/15/24 1416  BP: 123/79  Pulse: 90  Temp: 98 F (36.7 C)  TempSrc: Temporal  Weight: 166 lb (75.3 kg)  PainSc: 0-No pain   Body mass index is 22.83 kg/m.   General:  WDWN in NAD; vital signs documented above Gait: Not observed HENT: WNL, normocephalic Pulmonary: normal non-labored breathing without wheezing Cardiac: regular HR; without carotid bruits Abdomen: soft, NT, aortic pulse is not palpable Skin: without rashes Vascular Exam/Pulses:  Right Left  Radial 2+ (normal) 2+ (normal)  DP 2+ (normal) 2+ (normal)  PT 2+ (normal) 2+ (normal)   Extremities: varicose veins present in the left lower leg. Minimal swelling.   Neurologic: A&O X 3;  moving all extremities equally Psychiatric:  The pt has Normal affect.   Non-Invasive Vascular Imaging:   Venous duplex on 07/15/2024: +--------------+---------+------+-----------+------------+--------+  LEFT         Reflux NoRefluxReflux TimeDiameter cmsComments                          Yes                                   +--------------+---------+------+-----------+------------+--------+  CFV                    yes   >1 second                       +--------------+---------+------+-----------+------------+--------+  FV mid        no                                               +--------------+---------+------+-----------+------------+--------+  Popliteal              yes   >1 second                       +--------------+---------+------+-----------+------------+--------+  GSV at [facility]    no                            0.51              +--------------+---------+------+-----------+------------+--------+  GSV prox thighno                            0.41              +--------------+---------+------+-----------+------------+--------+  GSV mid thigh no                            0.22              +--------------+---------+------+-----------+------------+--------+  GSV dist thighno                            0.20              +--------------+---------+------+-----------+------------+--------+  GSV at knee   no                            0.18              +--------------+---------+------+-----------+------------+--------+  SSV at [facility]    no                            0.13              +--------------+---------+------+-----------+------------+--------+   Summary:  Left:  - No evidence of deep vein thrombosis from the common femoral through the popliteal veins.  - No evidence of superficial venous thrombosis.  - The deep venous system is not competent.  - The great and small saphenous veins are competent.     Ethan Booth is a 68 y.o. male who presents with: LLE varicose veins and mild swelling and achiness.     -pt has easily palpable pedal pulses bilaterally -in the left lower extremity, the pt does not have evidence of DVT.  Pt does have venous reflux in the deep left CFA and the popliteal artery.  The superficial venous system is competent -discussed with pt about continuing to wear knee high 15-20 mmHg compression stockings as he is getting relief with wearing them.  -discussed the importance of leg elevation and how to elevate properly  - pt is advised to elevate their legs and a diagram is  given to them to  demonstrate for pt to lay flat on their back with knees elevated and slightly bent with their feet higher than their knees, which puts their feet higher than their heart for 15 minutes per day.  If pt cannot lay flat, advised to lay as flat as possible.  Pt is already doing this.  Given [employer] name to purchase new wedge for elevation -pt is advised to continue as much walking as possible and avoid sitting or standing for long periods of time. He is very active but not as much since he has hurt his back.  -discussed importance of exercise and that water aerobics would also be beneficial, which he is already doing.  -handout with recommendations given -pt will f/u as needed.    Lucie Apt, [facility] [phone]  Clinic MD:  [place]

## 2024-07-20 ENCOUNTER — Ambulatory Visit (INDEPENDENT_AMBULATORY_CARE_PROVIDER_SITE_OTHER): Payer: Self-pay | Admitting: Sports Medicine

## 2024-07-20 DIAGNOSIS — M5416 Radiculopathy, lumbar region: Secondary | ICD-10-CM

## 2024-07-20 NOTE — Progress Notes (Signed)
    ESWT # 3   Patient had a trial with ESWT to his right SI and buttocks area and this seemed to help.  he is now walking better and has improved symptoms in spite of a known spondylolisthesis and neurogenic symptoms. PT with John OHalloran really helps. Since last visit some continued improvement Buttocks pain is mild but right lateral calf with burning pain persists.   Power level 90 mJ Impulses 2000 to right buttocks/ 500 to right lateral gastrocnemius Head size large Location from right SI joint down into upper buttocks Frequency 16       Complications and risks discussed with patient.  Repeat 1 week

## 2024-07-20 NOTE — Assessment & Plan Note (Signed)
 Some symptomatic progress with ESWT We will continue for a series of 6

## 2024-07-21 ENCOUNTER — Other Ambulatory Visit: Payer: Self-pay | Admitting: Internal Medicine

## 2024-07-21 DIAGNOSIS — R911 Solitary pulmonary nodule: Secondary | ICD-10-CM

## 2024-07-22 DIAGNOSIS — M48062 Spinal stenosis, lumbar region with neurogenic claudication: Secondary | ICD-10-CM | POA: Diagnosis not present

## 2024-07-22 DIAGNOSIS — M47816 Spondylosis without myelopathy or radiculopathy, lumbar region: Secondary | ICD-10-CM | POA: Diagnosis not present

## 2024-07-26 ENCOUNTER — Ambulatory Visit
Admission: RE | Admit: 2024-07-26 | Discharge: 2024-07-26 | Disposition: A | Discharge status: Home / Self Care | Source: Ambulatory Visit | Attending: Internal Medicine | Admitting: Internal Medicine

## 2024-07-26 DIAGNOSIS — R911 Solitary pulmonary nodule: Secondary | ICD-10-CM

## 2024-07-26 DIAGNOSIS — R590 Localized enlarged lymph nodes: Secondary | ICD-10-CM | POA: Diagnosis not present

## 2024-07-27 ENCOUNTER — Ambulatory Visit (INDEPENDENT_AMBULATORY_CARE_PROVIDER_SITE_OTHER): Payer: Self-pay | Admitting: Sports Medicine

## 2024-07-27 ENCOUNTER — Telehealth: Payer: Self-pay | Admitting: Neurosurgery

## 2024-07-27 DIAGNOSIS — M5416 Radiculopathy, lumbar region: Secondary | ICD-10-CM

## 2024-07-27 NOTE — Telephone Encounter (Signed)
 Patient is requesting a call back from Dr. Claudene regarding his surgery and the dental work he has been having. He states that he did not have any infection in the tooth of concern. He states that he does have a fractured tooth. He would like to know if he was able to get a temporary crown could he have his surgery done with our office and how long he would have to wait until he could have the permanent crown put in. Please advise.

## 2024-07-27 NOTE — Assessment & Plan Note (Signed)
 4 ESWT #4 today  He thinks the aqua therapy physical therapy and the ESWT have helped him being much more functional than he was. He still has significant pain on certain days and limitations in what he can do.  With this in mind I still think he may be a candidate for surgical intervention.

## 2024-07-27 NOTE — Progress Notes (Signed)
 ESWT # 4  Power level 90 mJ Impulses 3000 to right buttocks/Head size large Location from right gluteus medius at iliac crest to piriformisn in buttocks Frequency 16 Head size large       Complications and risks discussed with patient.   Repeat 1 week  Patient tolerated the procedure well.  At the conclusion he could walk more comfortably with his back straighter.  He thinks he is getting the benefit with this and we will continue through a full 6 treatments.  I still encouraged him to follow-up with the neurosurgeon for the possible foraminotomy.  Helene Haddock MD

## 2024-07-27 NOTE — Telephone Encounter (Addendum)
 Left message to return call to discuss.  Our usual office protocol is that we prefer dental work be taken care of any time prior to surgery. Otherwise, we prefer waiting until 3 months after surgery, if possible. Any dental infections would need to be cleared up prior to elective spine surgery.  Dr Theressa last note states he would like to see him back after he completes physical therapy. Dr Claudene is booking surgeries in September. I think by the time he finishes PT (if he hasn't already), sees Dr Claudene (has appointment on 08/16/24), and chooses a surgery date, he should hopefully be able to complete his dental work prior to surgery.

## 2024-07-28 NOTE — Telephone Encounter (Signed)
 I spoke with Ethan Booth this morning. He is scheduled to see his dentist next week for evaluation of a new fractured tooth. He anticipates that he will need a temporary crown while waiting 1-2 weeks for a permanent crown. We discussed that it would be ideal for him to take care of the dental work prior to his spine surgery. I also informed him that if there is any concern of infection, this will need to be resolved prior to any elective spine surgery.   He reports that he has continued to do physical therapy, but is at the point that he would like to consider surgery. I advised that I will need copies of the first and last PT note for the insurance authorization for surgery. He states he will have these notes faxed to our office or he will bring them to his appointment with Dr Claudene in a couple of weeks.

## 2024-07-29 DIAGNOSIS — M48062 Spinal stenosis, lumbar region with neurogenic claudication: Secondary | ICD-10-CM | POA: Diagnosis not present

## 2024-07-29 DIAGNOSIS — M47816 Spondylosis without myelopathy or radiculopathy, lumbar region: Secondary | ICD-10-CM | POA: Diagnosis not present

## 2024-08-05 DIAGNOSIS — M47816 Spondylosis without myelopathy or radiculopathy, lumbar region: Secondary | ICD-10-CM | POA: Diagnosis not present

## 2024-08-05 DIAGNOSIS — M48062 Spinal stenosis, lumbar region with neurogenic claudication: Secondary | ICD-10-CM | POA: Diagnosis not present

## 2024-08-10 ENCOUNTER — Ambulatory Visit (INDEPENDENT_AMBULATORY_CARE_PROVIDER_SITE_OTHER): Payer: Self-pay | Admitting: Sports Medicine

## 2024-08-10 DIAGNOSIS — M47816 Spondylosis without myelopathy or radiculopathy, lumbar region: Secondary | ICD-10-CM | POA: Diagnosis not present

## 2024-08-10 DIAGNOSIS — M48062 Spinal stenosis, lumbar region with neurogenic claudication: Secondary | ICD-10-CM | POA: Diagnosis not present

## 2024-08-10 DIAGNOSIS — M5416 Radiculopathy, lumbar region: Secondary | ICD-10-CM

## 2024-08-10 NOTE — Progress Notes (Signed)
 ESWT  Indication: Relief of sciatic pain down his right leg from a grade 2 spondylolisthesis  ESWT Power level 120 mJ Impulses 3000 Head size large Location upper and mid buttocks over the sciatic notch Frequency 16 Patient tolerated this procedure well without pain.  He notes that he gets 48 to 72 hours of relief but is still struggling with the chronic sciatica that limits his activity and ability to walk without pain.  Physical therapy helps.  Aquatic therapy helps.  Shockwave helps for a few days.  He is waiting to see if neurosurgery can do a procedure to give him more significant relief.  He will continue shockwave if the neurosurgery is not scheduled in the near future in order to get adequate relief.

## 2024-08-10 NOTE — Assessment & Plan Note (Signed)
 Shockwave immediately relieves his calf pain which stays away for a number of hours and continues to help for about 48 hours. He may continue to use this for palliation of pain as needed

## 2024-08-16 ENCOUNTER — Ambulatory Visit: Admitting: Neurosurgery

## 2024-08-16 ENCOUNTER — Telehealth: Payer: Self-pay

## 2024-08-16 ENCOUNTER — Other Ambulatory Visit: Payer: Self-pay

## 2024-08-16 ENCOUNTER — Encounter: Payer: Self-pay | Admitting: Neurosurgery

## 2024-08-16 VITALS — BP 128/70 | Ht 71.5 in | Wt 166.0 lb

## 2024-08-16 DIAGNOSIS — M4807 Spinal stenosis, lumbosacral region: Secondary | ICD-10-CM | POA: Diagnosis not present

## 2024-08-16 DIAGNOSIS — M5416 Radiculopathy, lumbar region: Secondary | ICD-10-CM

## 2024-08-16 DIAGNOSIS — M7138 Other bursal cyst, other site: Secondary | ICD-10-CM | POA: Insufficient documentation

## 2024-08-16 DIAGNOSIS — M48062 Spinal stenosis, lumbar region with neurogenic claudication: Secondary | ICD-10-CM

## 2024-08-16 DIAGNOSIS — Z01818 Encounter for other preprocedural examination: Secondary | ICD-10-CM

## 2024-08-16 DIAGNOSIS — M4727 Other spondylosis with radiculopathy, lumbosacral region: Secondary | ICD-10-CM | POA: Diagnosis not present

## 2024-08-16 NOTE — Progress Notes (Signed)
 Referring Physician:  Dwight Trula SQUIBB, MD 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  He had an acute onset of right lower extremity radiculopathy after lifting a suitcase.  He had severe pain which has been debilitating.  He has been trying to do everything to avoid surgery including physical therapy, anti-inflammatories, multiple different physical therapist, time.  He states that this pain continues to worsen.  He is not having any bowel or bladder dysfunction.  He often has to crawl on the floor because standing straight up causes severe pain in his right lower extremity.  He has been trying and has failed his conservative therapy.  His pain is impacting his ability to perform his activities of daily life.  He was previously running multiple miles a day.  Conservative measures:  Physical therapy: Has been working with physical therapy for the past 2 months.  Recently started with a new therapist. Multimodal medical therapy including regular antiinflammatories: Tramadol  Injections: He did have some improvement for approximately 1.5 weeks with a epidural steroid injection.  Past Surgery: none  I have utilized the care everywhere function in epic to review the outside records available from external health systems.  Review of Systems:  A 10 point review of systems is negative, except for the pertinent positives and negatives detailed in the HPI.  Past Medical History: Past Medical History:  Diagnosis Date   BPH (benign prostatic hyperplasia)    Diabetes (HCC)    Dyslipidemia    Dysplastic nevi    pre-cancerous   Hearing loss of left ear    wears hearing aid   High triglycerides    Insomnia    Low testosterone    Low vitamin D level     Past Surgical History: Past Surgical History:  Procedure Laterality Date    CHOLECYSTECTOMY  2008   dental implants     dysplastic nevi removal     insulin pump     Left knee ACL graft     VASECTOMY      Allergies: Allergies as of 08/16/2024 - Review Complete 08/16/2024  Allergen Reaction Noted   Empagliflozin  08/16/2024   Hydrocodone Itching 04/01/2024   Metformin hcl Diarrhea 08/16/2024   Statins Diarrhea and Other (See Comments) 05/09/2006   Morphine Itching and Rash 03/23/2021    Medications:  Current Outpatient Medications:    albuterol (VENTOLIN HFA) 108 (90 Base) MCG/ACT inhaler, SMARTSIG:1 Puff(s) By Mouth Every 4 Hours PRN, Disp: , Rfl:    Calcium 500-2.5 MG-MCG CHEW, , Disp: , Rfl:    Cholecalciferol 100 MCG (4000 UT) CAPS, , Disp: , Rfl:    Continuous Glucose Sensor (DEXCOM G7 SENSOR) MISC, , Disp: , Rfl:    cyanocobalamin (VITAMIN B12) 1000 MCG tablet, , Disp: , Rfl:    diclofenac Sodium (VOLTAREN) 1 % GEL, , Disp: , Rfl:    ezetimibe (ZETIA) 10 MG tablet, Take 10 mg by mouth daily., Disp: , Rfl:    gabapentin  (NEURONTIN ) 300 MG capsule, TAKE 3 CAPSULES(900 MG) BY MOUTH THREE TIMES DAILY, Disp: 270 capsule, Rfl: 2   Insulin Disposable Pump (OMNIPOD 5 DEXG7G6 INTRO GEN 5) KIT, every 3 days, Disp: , Rfl:    LORazepam (ATIVAN) 0.5 MG tablet, , Disp: , Rfl:    metFORMIN (GLUCOPHAGE-XR) 500 MG 24 hr tablet, Take  500 mg by mouth 2 (two) times daily., Disp: , Rfl:    silodosin (RAPAFLO) 8 MG CAPS capsule, TAKE 1 CAPSULE BY MOUTH DAILY, Disp: 90 capsule, Rfl: 3   traMADol (ULTRAM) 50 MG tablet, Take 50 mg by mouth 2 (two) times daily as needed., Disp: , Rfl:    aspirin EC 81 MG tablet, Take 81 mg by mouth daily. Swallow whole. (Patient not taking: Reported on 08/16/2024), Disp: , Rfl:   Social History: Social History   Tobacco Use   Smoking status: Never   Smokeless tobacco: Never  Vaping Use   Vaping status: Never Used    Family Medical History: No family history on file.  Physical Examination: Vitals:   08/16/24 1028  BP: 128/70     General: Patient is in no apparent distress. Attention to examination is appropriate.  Neck:   Supple.  Full range of motion.  Respiratory: Patient is breathing without any difficulty.   NEUROLOGICAL:     Awake, alert, oriented to person, place, and time.  Speech is clear and fluent.   Strength:  Side Iliopsoas Quads Hamstring PF DF EHL  R 5 5 5 5 5 5   L 5 5 5 5 5 5    Decreased right medial hamstrings reflex and right Achilles reflex.  Positive straight leg raise.  Bilateral upper and lower extremity sensation is intact to light touch at rest and when he is asymptomatic     No evidence of dysmetria noted.  Antalgic  Imaging: Narrative & Impression  CLINICAL DATA:  68 year old male with acute back pain, sciatica, left side symptoms.   EXAM: MRI LUMBAR SPINE WITHOUT CONTRAST   TECHNIQUE: Multiplanar, multisequence MR imaging of the lumbar spine was performed. No intravenous contrast was administered.   COMPARISON:  Lumbar radiographs 03/22/2024. CT Abdomen and Pelvis 11/23/2020.   FINDINGS: Segmentation: Designating the lowest ribs as T12 on the comparisons results in normal lumbar segmentation with a partially lumbarized S1-S2 level, vestigial disc space there. Correlation with radiographs is recommended prior to any operative intervention.   Alignment: Mild straightening of lumbar lordosis stable from the 2021 CT. Subtle retrolisthesis of L4 on L5 and mild grade 1 anterolisthesis of L5-S1 also not significantly changed since 2021. No significant scoliosis.   Vertebrae: Normal background bone marrow signal. Maintained vertebral body height. Intact visible sacrum and SI joints.   Confluent degenerative appearing marrow edema in the right side L5 and S1 facets, posterior elements (series 6, image 5). Underlying chronic facet arthropathy, see additional details of those levels below.   No other marrow edema or acute osseous abnormality.   Conus  medullaris and cauda equina: Conus partially visible at T12-L1, appears negative. Generally capacious lumbar spinal canal and normal cauda equina nerve roots.   Paraspinal and other soft tissues: Negative.   Disc levels:   T12-L1:  Negative.   L1-L2:  Negative.   L2-L3:  Negative.   L3-L4: Mild disc desiccation. Subtle disc bulging. Mild facet and ligament flavum hypertrophy.   L4-L5: Mild disc desiccation. Circumferential disc bulge. Mild to moderate facet and ligament flavum hypertrophy. Trace subligamentous synovial cysts suspected on series 8, image 24, not contributing to stenosis. Broad-based asymmetric left foraminal disc (same image). Borderline to mild spinal stenosis. No convincing lateral recess stenosis. Mild to moderate left and mild right L4 neural foraminal stenosis.   L5-S1: Mild grade 1 anterolisthesis. Disc desiccation. Circumferential disc/pseudo disc. Mild endplate spurring, but fairly severe facet and ligament flavum hypertrophy. Degenerative facet joint fluid  greater on the left (series 8, image 29). Facet and pedicle marrow edema confluent on the right as stated above. Posteriorly situated synovial cysts left greater than right (series 8, image 32) which should not cause neural compromise. And additional larger up to 7 mm diameter right side synovial cyst of the L5 facet best seen on series 6, image 4. This also does not appear to be contributing to neural impingement at this level.   Mild to moderate spinal stenosis (series 8, image 28) with moderate bilateral lateral recess stenosis (S1 nerve levels). Mild bilateral L5 neural foraminal stenosis.   S1-S2: Vestigial disc space.  Mild facet hypertrophy.  No stenosis.   IMPRESSION: 1. Mildly transitional anatomy with vestigial S1-S2 disc space. Lowest ribs are it T12. Correlation with radiographs is recommended prior to any operative intervention.   2. Severe facet arthropathy at L5-S1 in the  setting of mild chronic grade 1 spondylolisthesis there. Confluent marrow edema of the right L5 and S1 posterior elements appears to be Acute Exacerbation of the Facet Arthritis. 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 Overall Moderate multifactorial spinal and bilateral lateral recess stenosis (S1 nerve levels) with mild L5 foraminal stenosis.   3. Lesser degeneration at L4-L5 with borderline to mild spinal stenosis, up to moderate left and mild right L4 neural foraminal stenosis.     Electronically Signed   By: VEAR Hurst M.D.   On: 04/14/2024 05:49   I have personally reviewed the images and agree with the above interpretation.  No evidence of instability on flexion-extension x-rays.  Medical Decision Making/Assessment and Plan: Mr. Cid is a pleasant 68 y.o. male with a relatively acute onset of back and right lower extremity pain after living a hefty object.  He has now left with considerable right lower extremity debilitating pain.  States that his back pain is quite minimal at this time.  He has had a lumbar injection as well as 3 months of physical therapy.  Continues to have debilitating pain.  He often find himself stuck on the floor and have to crawl to the restroom.  He continues to have continued incapacitating pain.  He has significant stenosis at the L5-S1 level secondary to facet arthropathy, possible synovial cyst formation, compression of his S1 nerve root.  This is the most severe level and seems to correlate well with his pain.  He has a significant amount of stenosis.  We discussed again a decompression via minimally invasive laminectomy medial facetectomy and synovial cyst dissection/resection.  Since he is not have any hypermobility will plan on doing a decompression alone.  He has exhausted all of his conservative measures, continues to have pain that has impacted his quality of life and ability to perform his  activities of daily function.  Given the compression of the descending right-sided S1 nerve root secondary to the facet arthropathy, ligamentum flavum hypertrophy, and possible synovial cyst formation we will plan on a minimally invasive lumbar decompression at L5-S1 with resection of synovial cyst.  Will plan to have him work with our preoperative team and will schedule for surgery.  We discussed risk and benefits of surgery, including but not limited to a increased risk of CSF leak given possible prior cyst formation  Thank you for involving me in the care of this patient.    Penne MICAEL Sharps MD/MSCR Neurosurgery   Spent a total of 30 minutes with this patient today, reviewing his imaging, going over his  updated history and physical.  Discussing plan going forward, coordinating his care, and documentation.

## 2024-08-16 NOTE — Patient Instructions (Addendum)
 Please see below for information in regards to your upcoming surgery:   Planned surgery: Right Side L5/S1 Decompression with Synovial Cyst Resection, MIS (minimally invasive)   Surgery date: 09/07/24 at [facility] (Medical Mall: [address], [place], [place] [address]) - you will find out your arrival time the business day before your surgery.   Pre-op appointment at [facility] Pre-admit Testing: you will receive a call with a date/time for this appointment. If you are scheduled for an in person appointment, Pre-admit Testing is located on the first floor of the [facility] building, [address] [place], [address]. During this appointment, they will advise you which medications you can take the morning of surgery, and which medications you will need to hold for surgery. Labs (such as blood work, EKG) may be done at your pre-op appointment. You are not required to fast for these labs. Should you need to change your pre-op appointment, please call Pre-admit testing at [phone].     Blood thinners:   Aspirin 81mg :   stop aspirin 7 days prior, resume aspirin 14 days after     Diabetes/heart failure/kidney disease/weight loss medications that require an extended hold: Per anesthesia guidelines (due to the increased risk of aspiration caused by delayed gastric emptying):   Metformin: hold for 2 days prior to surgery     Common restrictions after spine surgery: No bending, lifting, or twisting ("BLT"). Avoid lifting objects heavier than 10 pounds for the first 6 weeks after surgery. Where possible, avoid household activities that involve lifting, bending, reaching, pushing, or pulling such as laundry, vacuuming, grocery shopping, and childcare. Try to arrange for help from friends and family for these activities while you heal. Do not drive while taking prescription pain medication. Weeks 6 through 12 after surgery: avoid lifting more than 25 pounds.      How to contact us :  If you have any questions/concerns before or after surgery, you can reach us  at [phone], or you can send a mychart message. We can be reached by phone or mychart 8am-4pm, Monday-Friday.  *Please note: Calls after 4pm are forwarded to a third party answering service. Mychart messages are not routinely monitored during evenings, weekends, and holidays. Please call our office to contact the answering service for urgent concerns during non-business hours.    If you have FMLA/disability paperwork, please drop it off or fax it to [phone]   Appointments/FMLA & disability paperwork: [employer] Registered Nurse/Surgery scheduler: Charlesetta Milliron, RN Certified Medical Assistants: Don, CMA, Elenor, CMA, & Damien, CMA Physician Assistants: Lyle Decamp, PA-C, Edsel Goods, PA-C & Glade Boys, PA-C Surgeons: Penne Sharps, MD & Reeves Daisy, MD   [facility] PREADMIT TESTING VISIT and SURGERY INFORMATION SHEET   Now that surgery has been scheduled you can anticipate several phone calls from [facility] services. A pharmacy technician will call you to verify your current list of medications taken at home.               The Pre-Service Center will call to verify your insurance information and to give you billing estimates and information.             The Preadmit Testing Office will be calling to schedule a visit to obtain information for the anesthesia team and provide instructions on preparation for surgery.  What can you expect for the Preadmit Testing Visit: Appointments may be scheduled in-person or by telephone.  If a telephone visit is scheduled, you  may be asked to come into the office to have lab tests or other studies performed.   This visit will not be completed any greater than 14 days prior to your surgery.  If your surgery has been scheduled for a future date, please do not be alarmed if we have not contacted you to  schedule an appointment more than a month prior to the surgery date.    Please be prepared to provide the following information during this appointment:            -Personal medical history                                               -Medication and allergy list            -Any history of problems with anesthesia              -Recent lab work or diagnostic studies            -Please notify us  of any needs we should be aware of to provide the best care possible           -You will be provided with instructions on how to prepare for your surgery.    On The Day of Surgery:  You must have a driver to take you home after surgery, you will be asked not to drive for 24 hours following surgery.  Taxi, Gisele and non-medical transport will not be acceptable means of transportation unless you have a responsible individual who will be traveling with you.  Visitors in the surgical area:   2 people will be able to visit you in your room once your preparation for surgery has been completed. During surgery, your visitors will be asked to wait in the Surgery Waiting Area.  It is not a requirement for them to stay, if they prefer to leave and come back.  Your visitor(s) will be given an update once the surgery has been completed.  No visitors are allowed in the initial recovery room to respect patient privacy and safety.  Once you are more awake and transfer to the secondary recovery area, or are transferred to an inpatient room, visitors will again be able to see you.  To respect and protect your privacy: We will ask on the day of surgery who your driver will be and what the contact number for that individual will be. We will ask if it is okay to share information with this individual, or if there is an alternative individual that we, or the surgeon, should contact to provide updates and information. If family or friends come to the surgical information desk requesting information about you, who you have not  listed with us , no information will be given.   It may be helpful to designate someone as the main contact who will be responsible for updating your other friends and family.    PREADMIT TESTING OFFICE: [phone] SAME DAY SURGERY: [phone] We look forward to caring for you before and throughout the process of your surgery.

## 2024-08-16 NOTE — H&P (View-Only) (Signed)
 Referring Physician:  Dwight Trula SQUIBB, MD 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  He had an acute onset of right lower extremity radiculopathy after lifting a suitcase.  He had severe pain which has been debilitating.  He has been trying to do everything to avoid surgery including physical therapy, anti-inflammatories, multiple different physical therapist, time.  He states that this pain continues to worsen.  He is not having any bowel or bladder dysfunction.  He often has to crawl on the floor because standing straight up causes severe pain in his right lower extremity.  He has been trying and has failed his conservative therapy.  His pain is impacting his ability to perform his activities of daily life.  He was previously running multiple miles a day.  Conservative measures:  Physical therapy: Has been working with physical therapy for the past 2 months.  Recently started with a new therapist. Multimodal medical therapy including regular antiinflammatories: Tramadol  Injections: He did have some improvement for approximately 1.5 weeks with a epidural steroid injection.  Past Surgery: none  I have utilized the care everywhere function in epic to review the outside records available from external health systems.  Review of Systems:  A 10 point review of systems is negative, except for the pertinent positives and negatives detailed in the HPI.  Past Medical History: Past Medical History:  Diagnosis Date   BPH (benign prostatic hyperplasia)    Diabetes (HCC)    Dyslipidemia    Dysplastic nevi    pre-cancerous   Hearing loss of left ear    wears hearing aid   High triglycerides    Insomnia    Low testosterone    Low vitamin D level     Past Surgical History: Past Surgical History:  Procedure Laterality Date    CHOLECYSTECTOMY  2008   dental implants     dysplastic nevi removal     insulin pump     Left knee ACL graft     VASECTOMY      Allergies: Allergies as of 08/16/2024 - Review Complete 08/16/2024  Allergen Reaction Noted   Empagliflozin  08/16/2024   Hydrocodone Itching 04/01/2024   Metformin hcl Diarrhea 08/16/2024   Statins Diarrhea and Other (See Comments) 05/09/2006   Morphine Itching and Rash 03/23/2021    Medications:  Current Outpatient Medications:    albuterol (VENTOLIN HFA) 108 (90 Base) MCG/ACT inhaler, SMARTSIG:1 Puff(s) By Mouth Every 4 Hours PRN, Disp: , Rfl:    Calcium 500-2.5 MG-MCG CHEW, , Disp: , Rfl:    Cholecalciferol 100 MCG (4000 UT) CAPS, , Disp: , Rfl:    Continuous Glucose Sensor (DEXCOM G7 SENSOR) MISC, , Disp: , Rfl:    cyanocobalamin (VITAMIN B12) 1000 MCG tablet, , Disp: , Rfl:    diclofenac Sodium (VOLTAREN) 1 % GEL, , Disp: , Rfl:    ezetimibe (ZETIA) 10 MG tablet, Take 10 mg by mouth daily., Disp: , Rfl:    gabapentin  (NEURONTIN ) 300 MG capsule, TAKE 3 CAPSULES(900 MG) BY MOUTH THREE TIMES DAILY, Disp: 270 capsule, Rfl: 2   Insulin Disposable Pump (OMNIPOD 5 DEXG7G6 INTRO GEN 5) KIT, every 3 days, Disp: , Rfl:    LORazepam (ATIVAN) 0.5 MG tablet, , Disp: , Rfl:    metFORMIN (GLUCOPHAGE-XR) 500 MG 24 hr tablet, Take  500 mg by mouth 2 (two) times daily., Disp: , Rfl:    silodosin (RAPAFLO) 8 MG CAPS capsule, TAKE 1 CAPSULE BY MOUTH DAILY, Disp: 90 capsule, Rfl: 3   traMADol (ULTRAM) 50 MG tablet, Take 50 mg by mouth 2 (two) times daily as needed., Disp: , Rfl:    aspirin EC 81 MG tablet, Take 81 mg by mouth daily. Swallow whole. (Patient not taking: Reported on 08/16/2024), Disp: , Rfl:   Social History: Social History   Tobacco Use   Smoking status: Never   Smokeless tobacco: Never  Vaping Use   Vaping status: Never Used    Family Medical History: No family history on file.  Physical Examination: Vitals:   08/16/24 1028  BP: 128/70     General: Patient is in no apparent distress. Attention to examination is appropriate.  Neck:   Supple.  Full range of motion.  Respiratory: Patient is breathing without any difficulty.   NEUROLOGICAL:     Awake, alert, oriented to person, place, and time.  Speech is clear and fluent.   Strength:  Side Iliopsoas Quads Hamstring PF DF EHL  R 5 5 5 5 5 5   L 5 5 5 5 5 5    Decreased right medial hamstrings reflex and right Achilles reflex.  Positive straight leg raise.  Bilateral upper and lower extremity sensation is intact to light touch at rest and when he is asymptomatic     No evidence of dysmetria noted.  Antalgic  Imaging: Narrative & Impression  CLINICAL DATA:  68 year old male with acute back pain, sciatica, left side symptoms.   EXAM: MRI LUMBAR SPINE WITHOUT CONTRAST   TECHNIQUE: Multiplanar, multisequence MR imaging of the lumbar spine was performed. No intravenous contrast was administered.   COMPARISON:  Lumbar radiographs 03/22/2024. CT Abdomen and Pelvis 11/23/2020.   FINDINGS: Segmentation: Designating the lowest ribs as T12 on the comparisons results in normal lumbar segmentation with a partially lumbarized S1-S2 level, vestigial disc space there. Correlation with radiographs is recommended prior to any operative intervention.   Alignment: Mild straightening of lumbar lordosis stable from the 2021 CT. Subtle retrolisthesis of L4 on L5 and mild grade 1 anterolisthesis of L5-S1 also not significantly changed since 2021. No significant scoliosis.   Vertebrae: Normal background bone marrow signal. Maintained vertebral body height. Intact visible sacrum and SI joints.   Confluent degenerative appearing marrow edema in the right side L5 and S1 facets, posterior elements (series 6, image 5). Underlying chronic facet arthropathy, see additional details of those levels below.   No other marrow edema or acute osseous abnormality.   Conus  medullaris and cauda equina: Conus partially visible at T12-L1, appears negative. Generally capacious lumbar spinal canal and normal cauda equina nerve roots.   Paraspinal and other soft tissues: Negative.   Disc levels:   T12-L1:  Negative.   L1-L2:  Negative.   L2-L3:  Negative.   L3-L4: Mild disc desiccation. Subtle disc bulging. Mild facet and ligament flavum hypertrophy.   L4-L5: Mild disc desiccation. Circumferential disc bulge. Mild to moderate facet and ligament flavum hypertrophy. Trace subligamentous synovial cysts suspected on series 8, image 24, not contributing to stenosis. Broad-based asymmetric left foraminal disc (same image). Borderline to mild spinal stenosis. No convincing lateral recess stenosis. Mild to moderate left and mild right L4 neural foraminal stenosis.   L5-S1: Mild grade 1 anterolisthesis. Disc desiccation. Circumferential disc/pseudo disc. Mild endplate spurring, but fairly severe facet and ligament flavum hypertrophy. Degenerative facet joint fluid  greater on the left (series 8, image 29). Facet and pedicle marrow edema confluent on the right as stated above. Posteriorly situated synovial cysts left greater than right (series 8, image 32) which should not cause neural compromise. And additional larger up to 7 mm diameter right side synovial cyst of the L5 facet best seen on series 6, image 4. This also does not appear to be contributing to neural impingement at this level.   Mild to moderate spinal stenosis (series 8, image 28) with moderate bilateral lateral recess stenosis (S1 nerve levels). Mild bilateral L5 neural foraminal stenosis.   S1-S2: Vestigial disc space.  Mild facet hypertrophy.  No stenosis.   IMPRESSION: 1. Mildly transitional anatomy with vestigial S1-S2 disc space. Lowest ribs are it T12. Correlation with radiographs is recommended prior to any operative intervention.   2. Severe facet arthropathy at L5-S1 in the  setting of mild chronic grade 1 spondylolisthesis there. Confluent marrow edema of the right L5 and S1 posterior elements appears to be Acute Exacerbation of the Facet Arthritis. 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 Overall Moderate multifactorial spinal and bilateral lateral recess stenosis (S1 nerve levels) with mild L5 foraminal stenosis.   3. Lesser degeneration at L4-L5 with borderline to mild spinal stenosis, up to moderate left and mild right L4 neural foraminal stenosis.     Electronically Signed   By: VEAR Hurst M.D.   On: 04/14/2024 05:49   I have personally reviewed the images and agree with the above interpretation.  No evidence of instability on flexion-extension x-rays.  Medical Decision Making/Assessment and Plan: Mr. Cid is a pleasant 68 y.o. male with a relatively acute onset of back and right lower extremity pain after living a hefty object.  He has now left with considerable right lower extremity debilitating pain.  States that his back pain is quite minimal at this time.  He has had a lumbar injection as well as 3 months of physical therapy.  Continues to have debilitating pain.  He often find himself stuck on the floor and have to crawl to the restroom.  He continues to have continued incapacitating pain.  He has significant stenosis at the L5-S1 level secondary to facet arthropathy, possible synovial cyst formation, compression of his S1 nerve root.  This is the most severe level and seems to correlate well with his pain.  He has a significant amount of stenosis.  We discussed again a decompression via minimally invasive laminectomy medial facetectomy and synovial cyst dissection/resection.  Since he is not have any hypermobility will plan on doing a decompression alone.  He has exhausted all of his conservative measures, continues to have pain that has impacted his quality of life and ability to perform his  activities of daily function.  Given the compression of the descending right-sided S1 nerve root secondary to the facet arthropathy, ligamentum flavum hypertrophy, and possible synovial cyst formation we will plan on a minimally invasive lumbar decompression at L5-S1 with resection of synovial cyst.  Will plan to have him work with our preoperative team and will schedule for surgery.  We discussed risk and benefits of surgery, including but not limited to a increased risk of CSF leak given possible prior cyst formation  Thank you for involving me in the care of this patient.    Penne MICAEL Sharps MD/MSCR Neurosurgery   Spent a total of 30 minutes with this patient today, reviewing his imaging, going over his  updated history and physical.  Discussing plan going forward, coordinating his care, and documentation.

## 2024-08-17 NOTE — Telephone Encounter (Signed)
 Mr Que requested to move up to a sooner surgery date if one became available. Surgery has been moved up to 08/24/24. Post-op appointments have been changed accordingly.

## 2024-08-18 ENCOUNTER — Inpatient Hospital Stay
Admission: RE | Admit: 2024-08-18 | Discharge: 2024-08-18 | Disposition: A | Discharge status: Home / Self Care | Source: Ambulatory Visit | Attending: Neurosurgery | Admitting: Neurosurgery

## 2024-08-18 DIAGNOSIS — M5417 Radiculopathy, lumbosacral region: Secondary | ICD-10-CM

## 2024-08-18 DIAGNOSIS — E119 Type 2 diabetes mellitus without complications: Secondary | ICD-10-CM | POA: Insufficient documentation

## 2024-08-18 DIAGNOSIS — Z01818 Encounter for other preprocedural examination: Secondary | ICD-10-CM | POA: Diagnosis not present

## 2024-08-18 DIAGNOSIS — Z01810 Encounter for preprocedural cardiovascular examination: Secondary | ICD-10-CM

## 2024-08-18 DIAGNOSIS — N401 Enlarged prostate with lower urinary tract symptoms: Secondary | ICD-10-CM

## 2024-08-18 HISTORY — DX: Asymptomatic varicose veins of unspecified lower extremity: I83.90

## 2024-08-18 HISTORY — DX: Other intervertebral disc degeneration, lumbar region without mention of lumbar back pain or lower extremity pain: M51.369

## 2024-08-18 HISTORY — DX: Spinal stenosis, lumbar region without neurogenic claudication: M48.061

## 2024-08-18 HISTORY — DX: Unspecified asthma, uncomplicated: J45.909

## 2024-08-18 HISTORY — DX: Gastro-esophageal reflux disease without esophagitis: K21.9

## 2024-08-18 HISTORY — DX: Radiculopathy, lumbosacral region: M54.17

## 2024-08-18 HISTORY — DX: Type 2 diabetes mellitus without complications: E11.9

## 2024-08-18 HISTORY — DX: Presence of insulin pump (external) (internal): Z96.41

## 2024-08-18 LAB — TYPE AND SCREEN
ABO/RH(D): O POS
Antibody Screen: NEGATIVE

## 2024-08-18 LAB — SURGICAL PCR SCREEN
MRSA, PCR: NEGATIVE
Staphylococcus aureus: NEGATIVE

## 2024-08-18 NOTE — Patient Instructions (Addendum)
 Your procedure is scheduled on:08-24-24 Tuesday Report to the Registration Desk on the 1st floor of the [facility].Then proceed to the 2nd floor Surgery Desk To find out your arrival time, please call [phone] between 1PM - 3PM on:08-20-24 Friday If your arrival time is 6:00 am, do not arrive before that time as the [facility] entrance doors do not open until 6:00 am.  REMEMBER: Instructions that are not followed completely may result in serious medical risk, up to and including death; or upon the discretion of your surgeon and anesthesiologist your surgery may need to be rescheduled.  Do not eat food after midnight the night before surgery.  No gum chewing or hard candies.  You may however, drink Water up to 2 hours before you are scheduled to arrive for your surgery. Do not drink anything within 2 hours of your scheduled arrival time.  One week prior to surgery:Stop NOW (08-18-24) Stop ANY OVER THE COUNTER supplements until after surgery (Vitamin D, Vitamin B12, Multivitamin, Calcium)  Stop your 81 mg Aspirin 7 days prior to surgery-Last dose on 08-17-24 Tuesday  Stop metFORMIN (GLUCOPHAGE-XR) 2 days prior to surgery-Last dose will be on 08-21-24 Saturday  Continue taking all of your other prescription medications up until the day of surgery.  ON THE DAY OF SURGERY ONLY TAKE THESE MEDICATIONS WITH SIPS OF WATER: -gabapentin  (NEURONTIN )   Bring your Albuterol Inhaler to the hospital  At midnight the night prior to your surgery, Reduce your Basal Rate on Insulin pump by 20%. Please call your Endocrinologist to inform them of this and to make sure you have the correct basal rate  No Alcohol for 24 hours before or after surgery.  No Smoking including e-cigarettes for 24 hours before surgery.  No chewable tobacco products for at least 6 hours before surgery.  No nicotine patches on the day of surgery.  Do not use any recreational drugs for at least a week (preferably 2 weeks)  before your surgery.  Please be advised that the combination of cocaine and anesthesia may have negative outcomes, up to and including death. If you test positive for cocaine, your surgery will be cancelled.  On the morning of surgery brush your teeth with toothpaste and water, you may rinse your mouth with mouthwash if you wish. Do not swallow any toothpaste or mouthwash.  Use CHG Soap as directed on instruction sheet.  Do not wear jewelry, make-up, hairpins, clips or nail polish.  For welded (permanent) jewelry: bracelets, anklets, waist bands, etc.  Please have this removed prior to surgery.  If it is not removed, there is a chance that hospital personnel will need to cut it off on the day of surgery.  Do not wear lotions, powders, or perfumes.   Do not shave body hair from the neck down 48 hours before surgery.  Contact lenses, hearing aids and dentures may not be worn into surgery.  Do not bring valuables to the hospital. [facility] is not responsible for any missing/lost belongings or valuables.   Notify your doctor if there is any change in your medical condition (cold, fever, infection).  Wear comfortable clothing (specific to your surgery type) to the hospital.  After surgery, you can help prevent lung complications by doing breathing exercises.  Take deep breaths and cough every 1-2 hours. Your doctor may order a device called an Incentive Spirometer to help you take deep breaths. When coughing or sneezing, hold a pillow firmly against your incision with both hands.  This is called "splinting." Doing this helps protect your incision. It also decreases belly discomfort.  If you are being admitted to the hospital overnight, leave your suitcase in the car. After surgery it may be brought to your room.  In case of increased patient census, it may be necessary for you, the patient, to continue your postoperative care in the Same Day Surgery department.  If you are being  discharged the day of surgery, you will not be allowed to drive home. You will need a responsible individual to drive you home and stay with you for 24 hours after surgery.   If you are taking public transportation, you will need to have a responsible individual with you.  Please call the Pre-admissions Testing Dept. at [phone] if you have any questions about these instructions.  Surgery Visitation Policy:  Patients having surgery or a procedure may have two visitors.  Children under the age of 1 must have an adult with them who is not the patient.     Pre-operative 5 CHG Bath Instructions   You can play a key role in reducing the risk of infection after surgery. Your skin needs to be as free of germs as possible. You can reduce the number of germs on your skin by washing with CHG (chlorhexidine gluconate) soap before surgery. CHG is an antiseptic soap that kills germs and continues to kill germs even after washing.   DO NOT use if you have an allergy to chlorhexidine/CHG or antibacterial soaps. If your skin becomes reddened or irritated, stop using the CHG and notify one of our RNs at [phone].   Please shower with the CHG soap starting 4 days before surgery using the following schedule:     Please keep in mind the following:  DO NOT shave, including legs and underarms, starting the day of your first shower.   You may shave your face at any point before/day of surgery.  Place clean sheets on your bed the day you start using CHG soap. Use a clean washcloth (not used since being washed) for each shower. DO NOT sleep with pets once you start using the CHG.   CHG Shower Instructions:  If you choose to wash your hair and private area, wash first with your normal shampoo/soap.  After you use shampoo/soap, rinse your hair and body thoroughly to remove shampoo/soap residue.  Turn the water OFF and apply about 3 tablespoons (45 ml) of CHG soap to a CLEAN washcloth.  Apply CHG  soap ONLY FROM YOUR NECK DOWN TO YOUR TOES (washing for 3-5 minutes)  DO NOT use CHG soap on face, private areas, open wounds, or sores.  Pay special attention to the area where your surgery is being performed.  If you are having back surgery, having someone wash your back for you may be helpful. Wait 2 minutes after CHG soap is applied, then you may rinse off the CHG soap.  Pat dry with a clean towel  Put on clean clothes/pajamas   If you choose to wear lotion, please use ONLY the CHG-compatible lotions on the back of this paper.     Additional instructions for the day of surgery: DO NOT APPLY any lotions, deodorants, cologne, or perfumes.   Put on clean/comfortable clothes.  Brush your teeth.  Ask your nurse before applying any prescription medications to the skin.      CHG Compatible Lotions   Aveeno Moisturizing lotion  Cetaphil Moisturizing Cream  Cetaphil Moisturizing Lotion  Clairol  Herbal Essence Moisturizing Lotion, Dry Skin  Clairol Herbal Essence Moisturizing Lotion, Extra Dry Skin  Clairol Herbal Essence Moisturizing Lotion, Normal Skin  Curel Age Defying Therapeutic Moisturizing Lotion with Alpha Hydroxy  Curel Extreme Care Body Lotion  Curel Soothing Hands Moisturizing Hand Lotion  Curel Therapeutic Moisturizing Cream, Fragrance-Free  Curel Therapeutic Moisturizing Lotion, Fragrance-Free  Curel Therapeutic Moisturizing Lotion, Original Formula  Eucerin Daily Replenishing Lotion  Eucerin Dry Skin Therapy Plus Alpha Hydroxy Crme  Eucerin Dry Skin Therapy Plus Alpha Hydroxy Lotion  Eucerin Original Crme  Eucerin Original Lotion  Eucerin Plus Crme Eucerin Plus Lotion  Eucerin TriLipid Replenishing Lotion  Keri Anti-Bacterial Hand Lotion  Keri Deep Conditioning Original Lotion Dry Skin Formula Softly Scented  Keri Deep Conditioning Original Lotion, Fragrance Free Sensitive Skin Formula  Keri Lotion Fast Absorbing Fragrance Free Sensitive Skin Formula  Keri  Lotion Fast Absorbing Softly Scented Dry Skin Formula  Keri Original Lotion  Keri Skin Renewal Lotion Keri Silky Smooth Lotion  Keri Silky Smooth Sensitive Skin Lotion  Nivea Body Creamy Conditioning Oil  Nivea Body Extra Enriched Teacher, adult education Moisturizing Lotion Nivea Crme  Nivea Skin Firming Lotion  NutraDerm 30 Skin Lotion  NutraDerm Skin Lotion  NutraDerm Therapeutic Skin Cream  NutraDerm Therapeutic Skin Lotion  ProShield Protective Hand Cream  Provon moisturizing lotion   Merchandiser, retail to address health-related social needs:  [email or web address]

## 2024-08-24 ENCOUNTER — Encounter
Admission: RE | Disposition: A | Discharge status: Home / Self Care | Payer: Self-pay | Source: Home / Self Care | Attending: Neurosurgery

## 2024-08-24 ENCOUNTER — Other Ambulatory Visit: Payer: Self-pay

## 2024-08-24 ENCOUNTER — Ambulatory Visit

## 2024-08-24 ENCOUNTER — Ambulatory Visit: Payer: Self-pay | Admitting: Urgent Care

## 2024-08-24 ENCOUNTER — Encounter: Payer: Self-pay | Admitting: Neurosurgery

## 2024-08-24 ENCOUNTER — Ambulatory Visit
Admission: RE | Admit: 2024-08-24 | Discharge: 2024-08-24 | Disposition: A | Discharge status: Home / Self Care | Attending: Neurosurgery | Admitting: Neurosurgery

## 2024-08-24 DIAGNOSIS — E781 Pure hyperglyceridemia: Secondary | ICD-10-CM | POA: Diagnosis not present

## 2024-08-24 DIAGNOSIS — M4317 Spondylolisthesis, lumbosacral region: Secondary | ICD-10-CM | POA: Diagnosis not present

## 2024-08-24 DIAGNOSIS — Z79624 Long term (current) use of inhibitors of nucleotide synthesis: Secondary | ICD-10-CM | POA: Insufficient documentation

## 2024-08-24 DIAGNOSIS — M7138 Other bursal cyst, other site: Secondary | ICD-10-CM | POA: Diagnosis not present

## 2024-08-24 DIAGNOSIS — Z7982 Long term (current) use of aspirin: Secondary | ICD-10-CM | POA: Diagnosis not present

## 2024-08-24 DIAGNOSIS — Z974 Presence of external hearing-aid: Secondary | ICD-10-CM | POA: Diagnosis not present

## 2024-08-24 DIAGNOSIS — M48061 Spinal stenosis, lumbar region without neurogenic claudication: Secondary | ICD-10-CM | POA: Insufficient documentation

## 2024-08-24 DIAGNOSIS — M5416 Radiculopathy, lumbar region: Secondary | ICD-10-CM | POA: Insufficient documentation

## 2024-08-24 DIAGNOSIS — E119 Type 2 diabetes mellitus without complications: Secondary | ICD-10-CM | POA: Diagnosis not present

## 2024-08-24 DIAGNOSIS — M48062 Spinal stenosis, lumbar region with neurogenic claudication: Secondary | ICD-10-CM

## 2024-08-24 DIAGNOSIS — Z79899 Other long term (current) drug therapy: Secondary | ICD-10-CM | POA: Diagnosis not present

## 2024-08-24 DIAGNOSIS — M4807 Spinal stenosis, lumbosacral region: Secondary | ICD-10-CM | POA: Insufficient documentation

## 2024-08-24 DIAGNOSIS — Z7984 Long term (current) use of oral hypoglycemic drugs: Secondary | ICD-10-CM | POA: Insufficient documentation

## 2024-08-24 DIAGNOSIS — Z01818 Encounter for other preprocedural examination: Secondary | ICD-10-CM

## 2024-08-24 DIAGNOSIS — H9192 Unspecified hearing loss, left ear: Secondary | ICD-10-CM | POA: Diagnosis not present

## 2024-08-24 HISTORY — PX: LUMBAR LAMINECTOMY/DECOMPRESSION MICRODISCECTOMY: SHX5026

## 2024-08-24 LAB — GLUCOSE, CAPILLARY
Glucose-Capillary: 80 mg/dL (ref 70–99)
Glucose-Capillary: 111 mg/dL — ABNORMAL HIGH (ref 70–99)

## 2024-08-24 LAB — ABO/RH: ABO/RH(D): O POS

## 2024-08-24 SURGERY — LUMBAR LAMINECTOMY/DECOMPRESSION MICRODISCECTOMY 1 LEVEL
Anesthesia: General | Site: Back | Laterality: Right

## 2024-08-24 MED ORDER — PHENYLEPHRINE HCL-NACL 20-0.9 MG/250ML-% IV SOLN
INTRAVENOUS | Status: AC
  Filled 2024-08-24: qty 250

## 2024-08-24 MED ORDER — SURGIFLO WITH THROMBIN (HEMOSTATIC MATRIX KIT) OPTIME
TOPICAL | Status: DC | PRN
  Administered 2024-08-24: 1 via TOPICAL

## 2024-08-24 MED ORDER — ONDANSETRON HCL 4 MG/2ML IJ SOLN
INTRAMUSCULAR | Status: AC
Start: 2024-08-24 — End: 2024-08-24
  Filled 2024-08-24: qty 2

## 2024-08-24 MED ORDER — METHYLPREDNISOLONE ACETATE 40 MG/ML IJ SUSP
INTRAMUSCULAR | Status: DC | PRN
  Administered 2024-08-24: 40 mg

## 2024-08-24 MED ORDER — SUGAMMADEX SODIUM 200 MG/2ML IV SOLN
INTRAVENOUS | Status: DC | PRN
Start: 2024-08-24 — End: 2024-08-24
  Administered 2024-08-24 (×2): 100 mg via INTRAVENOUS

## 2024-08-24 MED ORDER — FENTANYL CITRATE (PF) 100 MCG/2ML IJ SOLN
INTRAMUSCULAR | Status: AC
  Filled 2024-08-24: qty 2

## 2024-08-24 MED ORDER — LIDOCAINE HCL (CARDIAC) PF 100 MG/5ML IV SOSY
PREFILLED_SYRINGE | INTRAVENOUS | Status: DC | PRN
Start: 2024-08-24 — End: 2024-08-24
  Administered 2024-08-24: 60 mg via INTRAVENOUS

## 2024-08-24 MED ORDER — DEXAMETHASONE SODIUM PHOSPHATE 10 MG/ML IJ SOLN
INTRAMUSCULAR | Status: DC | PRN
Start: 2024-08-24 — End: 2024-08-24
  Administered 2024-08-24: 5 mg via INTRAVENOUS

## 2024-08-24 MED ORDER — CHLORHEXIDINE GLUCONATE 0.12 % MT SOLN
15.0000 mL | Freq: Once | OROMUCOSAL | Status: AC
Start: 2024-08-24 — End: 2024-08-24
  Administered 2024-08-24: 15 mL via OROMUCOSAL

## 2024-08-24 MED ORDER — METHYLPREDNISOLONE ACETATE 40 MG/ML IJ SUSP
INTRAMUSCULAR | Status: AC
  Filled 2024-08-24: qty 1

## 2024-08-24 MED ORDER — ORAL CARE MOUTH RINSE: 15.0000 mL | Freq: Once | OROMUCOSAL | Status: AC

## 2024-08-24 MED ORDER — GLYCOPYRROLATE 0.2 MG/ML IJ SOLN
INTRAMUSCULAR | Status: DC | PRN
Start: 2024-08-24 — End: 2024-08-24
  Administered 2024-08-24: .1 mg via INTRAVENOUS

## 2024-08-24 MED ORDER — ACETAMINOPHEN 10 MG/ML IV SOLN
INTRAVENOUS | Status: DC | PRN
Start: 2024-08-24 — End: 2024-08-24
  Administered 2024-08-24: 1000 mg via INTRAVENOUS

## 2024-08-24 MED ORDER — LACTATED RINGERS IV SOLN: INTRAVENOUS | Status: DC | PRN

## 2024-08-24 MED ORDER — MIDAZOLAM HCL 2 MG/2ML IJ SOLN
INTRAMUSCULAR | Status: AC
Start: 2024-08-24 — End: 2024-08-24
  Filled 2024-08-24: qty 2

## 2024-08-24 MED ORDER — BUPIVACAINE HCL (PF) 0.5 % IJ SOLN
INTRAMUSCULAR | Status: AC
  Filled 2024-08-24: qty 30

## 2024-08-24 MED ORDER — TRAMADOL HCL 50 MG PO TABS
50.0000 mg | ORAL_TABLET | Freq: Four times a day (QID) | ORAL | 0 refills | Status: DC | PRN
  Filled 2024-08-24: qty 30, 8d supply, fill #0

## 2024-08-24 MED ORDER — PROPOFOL 10 MG/ML IV BOLUS
INTRAVENOUS | Status: AC
  Filled 2024-08-24: qty 20

## 2024-08-24 MED ORDER — KETAMINE HCL 50 MG/5ML IJ SOSY
PREFILLED_SYRINGE | INTRAMUSCULAR | Status: AC
  Filled 2024-08-24: qty 5

## 2024-08-24 MED ORDER — ACETAMINOPHEN 10 MG/ML IV SOLN
INTRAVENOUS | Status: AC
  Filled 2024-08-24: qty 100

## 2024-08-24 MED ORDER — SODIUM CHLORIDE 0.9 % IV SOLN: INTRAVENOUS | Status: DC

## 2024-08-24 MED ORDER — KETAMINE HCL 10 MG/ML IJ SOLN
INTRAMUSCULAR | Status: DC | PRN
Start: 2024-08-24 — End: 2024-08-24
  Administered 2024-08-24: 25 mg via INTRAVENOUS

## 2024-08-24 MED ORDER — BUPIVACAINE-EPINEPHRINE (PF) 0.5% -1:200000 IJ SOLN
INTRAMUSCULAR | Status: AC
Start: 2024-08-24 — End: 2024-08-24
  Filled 2024-08-24: qty 10

## 2024-08-24 MED ORDER — CEFAZOLIN SODIUM-DEXTROSE 2-4 GM/100ML-% IV SOLN
INTRAVENOUS | Status: AC
  Filled 2024-08-24: qty 100

## 2024-08-24 MED ORDER — 0.9 % SODIUM CHLORIDE (POUR BTL) OPTIME
TOPICAL | Status: DC | PRN
Start: 2024-08-24 — End: 2024-08-24
  Administered 2024-08-24: 500 mL

## 2024-08-24 MED ORDER — CHLORHEXIDINE GLUCONATE 0.12 % MT SOLN
OROMUCOSAL | Status: AC
  Filled 2024-08-24: qty 15

## 2024-08-24 MED ORDER — PHENYLEPHRINE 80 MCG/ML (10ML) SYRINGE FOR IV PUSH (FOR BLOOD PRESSURE SUPPORT)
PREFILLED_SYRINGE | INTRAVENOUS | Status: DC | PRN
  Administered 2024-08-24 (×4): 80 ug via INTRAVENOUS

## 2024-08-24 MED ORDER — DROPERIDOL 2.5 MG/ML IJ SOLN: 0.6250 mg | Freq: Once | INTRAMUSCULAR | Status: DC | PRN

## 2024-08-24 MED ORDER — BUPIVACAINE-EPINEPHRINE (PF) 0.5% -1:200000 IJ SOLN
INTRAMUSCULAR | Status: DC | PRN
  Administered 2024-08-24: 6 mL

## 2024-08-24 MED ORDER — PHENYLEPHRINE HCL-NACL 20-0.9 MG/250ML-% IV SOLN
INTRAVENOUS | Status: DC | PRN
  Administered 2024-08-24: 20 ug/min via INTRAVENOUS

## 2024-08-24 MED ORDER — METHOCARBAMOL 500 MG PO TABS
500.0000 mg | ORAL_TABLET | Freq: Four times a day (QID) | ORAL | 0 refills | Status: DC
Start: 2024-08-24 — End: 2024-10-06
  Filled 2024-08-24: qty 120, 30d supply, fill #0

## 2024-08-24 MED ORDER — FENTANYL CITRATE (PF) 100 MCG/2ML IJ SOLN
25.0000 ug | INTRAMUSCULAR | Status: DC | PRN
  Administered 2024-08-24: 25 ug via INTRAVENOUS

## 2024-08-24 MED ORDER — FENTANYL CITRATE (PF) 100 MCG/2ML IJ SOLN
INTRAMUSCULAR | Status: DC | PRN
  Administered 2024-08-24 (×2): 25 ug via INTRAVENOUS
  Administered 2024-08-24: 50 ug via INTRAVENOUS

## 2024-08-24 MED ORDER — SODIUM CHLORIDE (PF) 0.9 % IJ SOLN
INTRAMUSCULAR | Status: AC
  Filled 2024-08-24: qty 10

## 2024-08-24 MED ORDER — CEFAZOLIN IN SODIUM CHLORIDE 2-0.9 GM/100ML-% IV SOLN
2.0000 g | Freq: Once | INTRAVENOUS | Status: AC
  Administered 2024-08-24: 2 g via INTRAVENOUS

## 2024-08-24 MED ORDER — GLYCOPYRROLATE 0.2 MG/ML IJ SOLN
INTRAMUSCULAR | Status: AC
  Filled 2024-08-24: qty 1

## 2024-08-24 MED ORDER — DEXAMETHASONE SODIUM PHOSPHATE 10 MG/ML IJ SOLN
INTRAMUSCULAR | Status: AC
Start: 2024-08-24 — End: 2024-08-24
  Filled 2024-08-24: qty 1

## 2024-08-24 MED ORDER — ROCURONIUM BROMIDE 100 MG/10ML IV SOLN
INTRAVENOUS | Status: DC | PRN
Start: 2024-08-24 — End: 2024-08-24
  Administered 2024-08-24: 50 mg via INTRAVENOUS

## 2024-08-24 MED ORDER — ONDANSETRON HCL 4 MG/2ML IJ SOLN
INTRAMUSCULAR | Status: DC | PRN
  Administered 2024-08-24: 4 mg via INTRAVENOUS

## 2024-08-24 MED ORDER — BUPIVACAINE LIPOSOME 1.3 % IJ SUSP
INTRAMUSCULAR | Status: AC
  Filled 2024-08-24: qty 10

## 2024-08-24 MED ORDER — MIDAZOLAM HCL 2 MG/2ML IJ SOLN
INTRAMUSCULAR | Status: DC | PRN
Start: 2024-08-24 — End: 2024-08-24
  Administered 2024-08-24: 2 mg via INTRAVENOUS

## 2024-08-24 MED ORDER — PROPOFOL 10 MG/ML IV BOLUS
INTRAVENOUS | Status: DC | PRN
  Administered 2024-08-24: 150 mg via INTRAVENOUS

## 2024-08-24 SURGICAL SUPPLY — 30 items
BASIN KIT SINGLE STR (MISCELLANEOUS) ×1 IMPLANT
BRUSH SCRUB EZ 4% CHG (MISCELLANEOUS) ×1 IMPLANT
BUR NEURO DRILL SOFT 3.0X3.8M (BURR) ×1 IMPLANT
DERMABOND ADVANCED .7 DNX12 (GAUZE/BANDAGES/DRESSINGS) ×1 IMPLANT
DRAPE C-ARM XRAY 36X54 (DRAPES) ×2 IMPLANT
DRAPE LAPAROTOMY 100X77 ABD (DRAPES) ×1 IMPLANT
DRAPE MICROSCOPE SPINE 48X150 (DRAPES) ×1 IMPLANT
DRSG OPSITE POSTOP 3X4 (GAUZE/BANDAGES/DRESSINGS) ×1 IMPLANT
DRSG TEGADERM 4X4.75 (GAUZE/BANDAGES/DRESSINGS) IMPLANT
ELECTRODE EZSTD 165MM 6.5IN (MISCELLANEOUS) ×1 IMPLANT
ELECTRODE REM PT RTRN 9FT ADLT (ELECTROSURGICAL) ×1 IMPLANT
GLOVE BIOGEL PI IND STRL 7.0 (GLOVE) ×1 IMPLANT
GLOVE BIOGEL PI IND STRL 8 (GLOVE) ×2 IMPLANT
GLOVE SURG SYN 7.0 PF PI (GLOVE) ×1 IMPLANT
GLOVE SURG SYN 7.5 PF PI (GLOVE) ×1 IMPLANT
GOWN SRG XL LVL 3 NONREINFORCE (GOWNS) ×1 IMPLANT
GOWN STRL REUS W/ TWL LRG LVL3 (GOWN DISPOSABLE) ×1 IMPLANT
KIT WILSON FRAME (KITS) ×1 IMPLANT
KNIFE BAYONET SHORT DISCETOMY (MISCELLANEOUS) IMPLANT
NDL SAFETY ECLIPSE 18X1.5 (NEEDLE) ×1 IMPLANT
NS IRRIG 500ML POUR BTL (IV SOLUTION) ×1 IMPLANT
PACK LAMINECTOMY ARMC (PACKS) ×1 IMPLANT
PAD ARMBOARD POSITIONER FOAM (MISCELLANEOUS) ×2 IMPLANT
SURGIFLO W/THROMBIN 8M KIT (HEMOSTASIS) ×1 IMPLANT
SUT STRATA 3-0 15 PS-2 (SUTURE) ×1 IMPLANT
SUT VIC AB 0 CT1 27XCR 8 STRN (SUTURE) ×1 IMPLANT
SUT VIC AB 2-0 CT1 18 (SUTURE) ×1 IMPLANT
SYR 30ML LL (SYRINGE) ×2 IMPLANT
SYR 3ML LL SCALE MARK (SYRINGE) ×1 IMPLANT
TRAP FLUID SMOKE EVACUATOR (MISCELLANEOUS) ×1 IMPLANT

## 2024-08-24 NOTE — Anesthesia Procedure Notes (Addendum)
 Procedure Name: Intubation Date/Time: 08/24/2024 2:12 PM  Performed by: Dario Barter, MDPre-anesthesia Checklist: Patient identified, Emergency Drugs available, Suction available and Patient being monitored Patient Re-evaluated:Patient Re-evaluated prior to induction Oxygen Delivery Method: Circle system utilized Preoxygenation: Pre-oxygenation with 100% oxygen Induction Type: IV induction Ventilation: Mask ventilation without difficulty Grade View: Grade I Tube type: Oral Tube size: 7.5 mm Number of attempts: 1 Airway Equipment and Method: Stylet Placement Confirmation: ETT inserted through vocal cords under direct vision, positive ETCO2 and breath sounds checked- equal and bilateral Secured at: 22 cm Tube secured with: Tape Dental Injury: Teeth and Oropharynx as per pre-operative assessment  Comments: Teeth unchanged from prior. Right front tooth remains slightly loose as described prior to surgery. Atraumatic intubation.

## 2024-08-24 NOTE — Transfer of Care (Signed)
 Immediate Anesthesia Transfer of Care Note  Patient: Ethan SHAUNNA Shams, MD  Procedure(s) Performed: Right Side L5/S1 Decompression with Synovial Cyst Resection, MIS (Right: Back)  Patient Location: PACU  Anesthesia Type:General  Level of Consciousness: drowsy and responds to stimulation  Airway & Oxygen Therapy: Patient Spontanous Breathing and Patient connected to face mask oxygen  Post-op Assessment: Report given to RN and Post -op Vital signs reviewed and stable  Post vital signs: Reviewed and stable  Last Vitals:  Vitals Value Taken Time  BP 129/77 08/24/24 15:34  Temp    Pulse 69 08/24/24 15:37  Resp 12 08/24/24 15:37  SpO2 100 % 08/24/24 15:37  Vitals shown include unfiled device data.  Last Pain:  Vitals:   08/24/24 1041  TempSrc: Temporal  PainSc: 0-No pain         Complications: No notable events documented.

## 2024-08-24 NOTE — Inpatient Diabetes Management (Signed)
 Inpatient Diabetes Program Recommendations  AACE/ADA: New Consensus Statement on Inpatient Glycemic Control   Target Ranges:  Prepandial:   less than 140 mg/dL      Peak postprandial:   less than 180 mg/dL (1-2 hours)      Critically ill patients:  140 - 180 mg/dL    Latest Reference Range & Units 08/24/24 10:06  Glucose-Capillary 70 - 99 mg/dL 80   Review of Glycemic Control  Diabetes history: DM2 Outpatient Diabetes medications: Omnipod (Humalog) and Metformin 500mg  BID Off pump regiman? - Tresiba 4-10 units HS and Humalog 5-10 units TID.   Current orders for Inpatient glycemic control: None - currently in OR  Inpatient Diabetes Program Recommendations: If pt is admitted - please add insulin pump order set  with CGBs AC/HS and 0200 and insulin pump AC/HS and 0200.   Noted consult for inpatient diabetes. Chart reviewed, pt currently on OR. Per chart, pt had DM2 and sees Dr Tommas for diabetes management. Last A1C 6.7% on 06/11/24. Pt last seen Dr Tommas on 08/18/24. If pt is admitted, provider will need to order insulin pump order set. Inpatient diabetes team will continue to follow if admitted.   Thanks,  Earnie Gainer, RN, MSN, CDCES Diabetes Coordinator Inpatient Diabetes Program [phone] (Team Pager from 8am to 5pm)

## 2024-08-24 NOTE — Discharge Summary (Signed)
 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 His intraoperative course was uncomplicated.  He was monitored in PACU and discharged home after ambulating, urinating, and tolerating p.o. intake.  He was given prescriptions for pain medication and muscle relaxer.  Consults: None  Significant Diagnostic Studies: NA  Treatments: surgery: as above. Please see separately dictated operative report for further details  Discharge Exam: Blood pressure (!) 135/93, pulse 72, temperature (!) 96.9 F (36.1 C), temperature source Temporal, resp. rate 16, SpO2 97%. CN grossly intact MAEW Incision c/d/I and covered with clean post-op dressing  Disposition: Discharge disposition: 01-Home or Self Care        Allergies as of 08/24/2024       Reactions   Hydrocodone Itching   Jardiance [empagliflozin] Other (See Comments)   yeast   Metformin Hcl Diarrhea   If he exceeds 500mg  per dose   Statins Diarrhea, Other (See Comments)   Flu like symptoms, stuttering   Morphine Itching, Rash        Medication List     TAKE these medications    albuterol 108 (90 Base) MCG/ACT inhaler Commonly known as: VENTOLIN HFA Inhale 1-2 puffs into the lungs every 6 (six) hours as needed for wheezing or shortness of breath.   aspirin EC 81 MG tablet Take 81 mg by mouth daily. Swallow whole.   Cholecalciferol 50 MCG (2000 UT) Tabs Take 6,000 Units by mouth at bedtime.   cyanocobalamin 1000 MCG tablet Commonly known as:  VITAMIN B12 Take 1,000 mcg by mouth in the morning.   Dexcom G7 Sensor Misc   ezetimibe 10 MG tablet Commonly known as: ZETIA Take 10 mg by mouth in the morning.   gabapentin  300 MG capsule Commonly known as: NEURONTIN  TAKE 3 CAPSULES(900 MG) BY MOUTH THREE TIMES DAILY   ketoconazole 2 % cream Commonly known as: NIZORAL Apply 1 Application topically daily.   LORazepam 0.5 MG tablet Commonly known as: ATIVAN Take 0.75-1 mg by mouth at bedtime.   metFORMIN 500 MG 24 hr tablet Commonly known as: GLUCOPHAGE-XR Take 500 mg by mouth 2 (two) times daily.   methocarbamol  500 MG tablet Commonly known as: ROBAXIN  Take 1 tablet (500 mg total) by mouth 4 (four) times daily.   multivitamin with minerals Tabs tablet Take 1 tablet by mouth every evening.   Omnipod 5 DexG7G6 Intro Gen 5 Kit HUMALOG INSULIN--INJECT UP TO 50 UNITS PER DAY VIA OMNIPOD   oyster calcium 500 MG Tabs tablet Take 500 mg of elemental calcium by mouth in the morning.   silodosin 8 MG Caps capsule Commonly known as: RAPAFLO TAKE 1 CAPSULE BY MOUTH DAILY What changed: when to take this   Testosterone 20.25 MG/1.25GM (1.62%) Gel Apply 1 Pump topically daily.   traMADol  50 MG tablet Commonly known as: ULTRAM  Take 1 tablet (50 mg total) by mouth 4 (four) times daily as needed for severe pain (pain score 7-10). Take 2 tablets (100 mg) by mouth scheduled at bedtime. Take 1 tablet (50 mg) by mouth in the morning, if needed for  pain. What changed:  how much to take when to take this reasons to take this   valACYclovir 500 MG tablet Commonly known as: VALTREX Take 500 mg by mouth in the morning.         Signed: Edsel Jama Goods 08/24/2024, 3:13 PM

## 2024-08-24 NOTE — Anesthesia Preprocedure Evaluation (Signed)
 Anesthesia Evaluation  Patient identified by MRN, date of birth, ID band Patient awake    Reviewed: Allergy & Precautions, H&P , NPO status , Patient's Chart, lab work & pertinent test results, reviewed documented beta blocker date and time   History of Anesthesia Complications Negative for: history of anesthetic complications  Airway Mallampati: III  TM Distance: >3 FB Neck ROM: full    Dental  (+) Dental Advidsory Given, Implants, Caps, Teeth Intact, Missing, Poor Dentition   Pulmonary neg shortness of breath, asthma , neg COPD, Recent URI , Resolved   Pulmonary exam normal breath sounds clear to auscultation       Cardiovascular Exercise Tolerance: Good negative cardio ROS Normal cardiovascular exam Rhythm:regular Rate:Normal     Neuro/Psych neg Seizures  Neuromuscular disease  negative psych ROS   GI/Hepatic Neg liver ROS,GERD  ,,  Endo/Other  diabetes, Well Controlled, Insulin Dependent    Renal/GU negative Renal ROS  negative genitourinary   Musculoskeletal   Abdominal   Peds  Hematology negative hematology ROS (+)   Anesthesia Other Findings Past Medical History: No date: Asthma     Comment:  well controlled No date: BPH (benign prostatic hyperplasia) No date: DDD (degenerative disc disease), lumbar No date: DM (diabetes mellitus), type 2 (HCC) No date: Dyslipidemia No date: Dysplastic nevi     Comment:  pre-cancerous No date: GERD (gastroesophageal reflux disease) No date: Hearing loss of left ear     Comment:  wears hearing aid No date: High triglycerides No date: Insomnia No date: Insulin pump in place No date: Low testosterone No date: Low vitamin D level No date: Lumbosacral radiculopathy No date: Spinal stenosis of lumbar region No date: Varicose vein of leg   Reproductive/Obstetrics negative OB ROS                              Anesthesia Physical Anesthesia  Plan  ASA: 2  Anesthesia Plan: General   Post-op Pain Management:    Induction: Intravenous  PONV Risk Score and Plan: 2 and Ondansetron , Dexamethasone , Midazolam  and Treatment may vary due to age or medical condition  Airway Management Planned: Oral ETT  Additional Equipment:   Intra-op Plan:   Post-operative Plan: Extubation in OR  Informed Consent: I have reviewed the patients History and Physical, chart, labs and discussed the procedure including the risks, benefits and alternatives for the proposed anesthesia with the patient or authorized representative who has indicated his/her understanding and acceptance.     Dental Advisory Given  Plan Discussed with: Anesthesiologist, CRNA and Surgeon  Anesthesia Plan Comments:          Anesthesia Quick Evaluation

## 2024-08-24 NOTE — Interval H&P Note (Signed)
 History and Physical Interval Note:  08/24/2024 1:35 PM  Ethan SHAUNNA Shams, MD  has presented today for surgery, with the diagnosis of Lumbar Radiculopathy secondary to Synovial Cyst.  The various methods of treatment have been discussed with the patient and family. After consideration of risks, benefits and other options for treatment, the patient has consented to  Procedure(s) with comments: Right Side L5/S1 Decompression with Synovial Cyst Resection, MIS (Right) - Right Side L5/S1 Decompression with Synovial Cyst Resection, MIS as a surgical intervention.  The patient's history has been reviewed, patient examined, no change in status, stable for surgery.  I have reviewed the patient's chart and labs.  Questions were answered to the patient's satisfaction.    Heart and Lungs are Clear   [name]

## 2024-08-24 NOTE — Discharge Instructions (Addendum)

## 2024-08-24 NOTE — Op Note (Signed)
 Indications: [name] is suffering from lumbar radiculopathy secondary to a facet cyst, refractory to all conservative management including injection therapy and watchful waiting. The patient tried and failed conservative management, prompting surgical intervention.  Pain continued to be debilitating.  Interfering with his quality of life and activities of daily living.  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   Preoperative Note:   Risks of surgery discussed include: infection, bleeding, stroke, coma, death, paralysis, CSF leak, nerve/spinal cord injury, numbness, tingling, weakness, complex regional pain syndrome, recurrent stenosis and/or disc herniation, vascular injury, development of instability, neck/back pain, need for further surgery, persistent symptoms, development of deformity, and the risks of anesthesia. The patient understood these risks and agreed to proceed.  Operative Note:   1) right L 5/S1 decompression, resection of extradural synovial cyst/joint cyst 2) microsurgery/microscopy  The patient was then brought from the preoperative center with intravenous access established.  The patient underwent general anesthesia and endotracheal tube intubation, and was then rotated on the [place] rail top where all pressure points were appropriately padded.  The skin was then thoroughly cleansed.  Perioperative antibiotic prophylaxis was administered.  Sterile prep and drapes were then applied and a timeout was then  observed.  C-arm was brought into the field under sterile conditions, and the L 5-S1 disc space identified and marked with an incision on the right 1cm lateral to midline.  Once this was complete a 2 cm incision was opened with the use of a #10 blade knife.  The Metrx tubes were sequentially advanced under lateral fluoroscopy until a 18 x 50 mm Metrx tube was placed over the facet and lamina and secured to the bed.    The microscope was then sterilely brought into the field and muscle creep was hemostased with a bipolar and resected with a pituitary rongeur.  A Bovie extender was then used to expose the spinous process and lamina.  Careful attention was placed to not violate the facet capsule. A 3 mm matchstick drill bit was then used to make a hemi-laminotomy trough until the ligamentum flavum was exposed.  This was extended to the base of the spinous process.  Once this was complete and the underlying ligamentum flavum was visualized, the ligamentum was dissected with an up angle curette starting at the medial side, we are then able to extend this ligamentous dissection caudally until the superior aspect of the S1 lamina was noted.  We then went to the most cranial portion of our exposure and took the ligamentum out laterally.  As we continue to move down we noted a large cystic structure extruding from the joint causing displacement and compression of the traversing neural structures.  This was quite adherent to the dura and required significant micro surgical dissection.  We were able to separate the cyst from the dura without any evidence of a durotomy.  Once this was clear from the dura we were able to resected and sent it off for pathology.  There was some residual ligamentum flavum on the more caudal and so we continue to follow this distally.  We cleaned out the lateral recesses and examine the nerve root as it was traversing.   we examined the disc itself and it did not show any evidence of clear  extrusion.  Meticulous hemostasis was obtained.  The area was irrigated copiously.  Depo-Medrol  was placed around the nerve root.  The fascial layer was reapproximated with the use of a 0- Vicryl suture.  Subcutaneous tissue layer was reapproximated using 2-0 Vicryl suture.  The skin was then cleansed and Dermabond was used to close the skin opening.  Patient was then rotated back to the preoperative bed awakened from anesthesia and taken to recovery all counts are correct in this case.   I performed the entire procedure with the assistance of Edsel Goods, PA-C as an Designer, television/film set. An assistant was required for this procedure due to the complexity.  The assistant provided assistance in tissue manipulation and suction, closure and was required for the successful and safe performance of the procedure. I performed the critical portions of the procedure.   [name], MD [facility] Neurosurgery

## 2024-08-25 ENCOUNTER — Encounter: Payer: Self-pay | Admitting: Neurosurgery

## 2024-08-25 NOTE — Anesthesia Postprocedure Evaluation (Signed)
 Anesthesia Post Note  Patient: Ethan SHAUNNA Shams, MD  Procedure(s) Performed: Right Side L5/S1 Decompression with Synovial Cyst Resection, MIS (Right: Back)  Patient location during evaluation: PACU Anesthesia Type: General Level of consciousness: awake and alert Pain management: pain level controlled Vital Signs Assessment: post-procedure vital signs reviewed and stable Respiratory status: spontaneous breathing, nonlabored ventilation, respiratory function stable and patient connected to nasal cannula oxygen Cardiovascular status: blood pressure returned to baseline and stable Postop Assessment: no apparent nausea or vomiting Anesthetic complications: no   No notable events documented.   Last Vitals:  Vitals:   08/24/24 1710 08/24/24 1719  BP: (!) 151/89 131/76  Pulse: 60   Resp:    Temp: (!) 36.3 C   SpO2: 100%     Last Pain:  Vitals:   08/24/24 1710  TempSrc:   PainSc: 0-No pain                 Prentice Murphy

## 2024-08-27 LAB — SURGICAL PATHOLOGY

## 2024-09-06 ENCOUNTER — Encounter: Payer: Self-pay | Admitting: Physician Assistant

## 2024-09-06 ENCOUNTER — Ambulatory Visit (INDEPENDENT_AMBULATORY_CARE_PROVIDER_SITE_OTHER): Admitting: Physician Assistant

## 2024-09-06 VITALS — BP 134/78 | Temp 98.2°F | Ht 71.5 in | Wt 169.0 lb

## 2024-09-06 DIAGNOSIS — M48062 Spinal stenosis, lumbar region with neurogenic claudication: Secondary | ICD-10-CM

## 2024-09-06 DIAGNOSIS — Z09 Encounter for follow-up examination after completed treatment for conditions other than malignant neoplasm: Secondary | ICD-10-CM

## 2024-09-06 NOTE — Progress Notes (Signed)
   REFERRING PHYSICIAN:  Dwight Trula SQUIBB, Md 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 he is doing much better.  His pain and ability to ambulate has improved quite a bit.  He notices that it times and he sits on the toilet he will get more pain in his calves.  He feels as though his right side has improved so much that he started to notice his left sided pain more.  Otherwise no complaints.   PHYSICAL EXAMINATION:  General: Patient is well developed, well nourished, calm, collected, and in no apparent distress.   NEUROLOGICAL:  General: In no acute distress.   Awake, alert, oriented to person, place, and time.  Pupils equal round and reactive to light.  Facial tone is symmetric.     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 he will follow up in approximately 1 month for 6-week postop visit. I have advised the patient to lift up to 10 pounds until 6 weeks after surgery, then increase up to 25 pounds until 12 weeks after surgery.  After 12 weeks post-op, the patient advised to increase activity as tolerated.  Advised to contact the office if any questions or concerns arise.  Lyle Decamp PA-C Department of neurosurgery

## 2024-09-07 DIAGNOSIS — M48061 Spinal stenosis, lumbar region without neurogenic claudication: Secondary | ICD-10-CM | POA: Insufficient documentation

## 2024-09-07 DIAGNOSIS — M7138 Other bursal cyst, other site: Secondary | ICD-10-CM | POA: Insufficient documentation

## 2024-09-07 DIAGNOSIS — M5416 Radiculopathy, lumbar region: Secondary | ICD-10-CM | POA: Insufficient documentation

## 2024-09-20 ENCOUNTER — Encounter: Admitting: Physician Assistant

## 2024-09-22 ENCOUNTER — Other Ambulatory Visit: Payer: Self-pay | Admitting: Medical Genetics

## 2024-10-04 ENCOUNTER — Other Ambulatory Visit: Payer: Self-pay | Admitting: Sports Medicine

## 2024-10-06 ENCOUNTER — Ambulatory Visit: Admitting: Neurosurgery

## 2024-10-06 ENCOUNTER — Encounter: Payer: Self-pay | Admitting: Neurosurgery

## 2024-10-06 VITALS — BP 118/76 | Wt 171.2 lb

## 2024-10-06 DIAGNOSIS — M48062 Spinal stenosis, lumbar region with neurogenic claudication: Secondary | ICD-10-CM

## 2024-10-06 DIAGNOSIS — M7138 Other bursal cyst, other site: Secondary | ICD-10-CM

## 2024-10-06 DIAGNOSIS — Z09 Encounter for follow-up examination after completed treatment for conditions other than malignant neoplasm: Secondary | ICD-10-CM

## 2024-10-11 NOTE — Progress Notes (Signed)
   REFERRING PHYSICIAN:  Dwight Trula SQUIBB, Md 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 he is doing much better.  His pain and ability to ambulate has improved quite a bit.  He does get some soreness mostly when he is sitting on the toilet but his ambulation has improved so significantly he is very pleased.   PHYSICAL EXAMINATION:  General: Patient is well developed, well nourished, calm, collected, and in no apparent distress.   NEUROLOGICAL:  General: In no acute distress.   Awake, alert, oriented to person, place, and time.  Pupils equal round and reactive to light.  Facial tone is symmetric.     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  Overall he is doing very well.  His ambulation function has gotten significantly better.  He does have intermittent lower extremity pain when sitting in certain positions but as a whole he has had a massive improvement and is pleased with his outcome so far.  Will plan to see him back for follow-up.    Penne MICAEL Sharps, MD Department of neurosurgery

## 2024-10-20 ENCOUNTER — Encounter: Admitting: Neurosurgery

## 2024-11-15 ENCOUNTER — Encounter: Payer: Self-pay | Admitting: Physician Assistant

## 2024-11-15 ENCOUNTER — Ambulatory Visit: Admitting: Physician Assistant

## 2024-11-15 VITALS — BP 122/70 | Temp 98.2°F | Ht 71.5 in | Wt 174.1 lb

## 2024-11-15 DIAGNOSIS — M7138 Other bursal cyst, other site: Secondary | ICD-10-CM

## 2024-11-15 DIAGNOSIS — Z09 Encounter for follow-up examination after completed treatment for conditions other than malignant neoplasm: Secondary | ICD-10-CM

## 2024-11-15 DIAGNOSIS — M5416 Radiculopathy, lumbar region: Secondary | ICD-10-CM

## 2024-11-15 MED ORDER — GABAPENTIN 300 MG PO CAPS: 300.0000 mg | ORAL_CAPSULE | Freq: Four times a day (QID) | ORAL | 2 refills | Status: DC

## 2024-11-15 NOTE — Progress Notes (Signed)
   REFERRING PHYSICIAN:  Dwight Trula SQUIBB, Ethan Booth [address] [place],  [place] [address]  DOS: R L5-S1 decompresion w/ synovial cyst resection 08/24/24  HISTORY OF PRESENT ILLNESS: Ethan SQUIBB Shams, Ethan Booth is approximately 11 weeks status post right sided L5-S1 decompression with synovial cyst resection. he was doing much better however had some recent travel over the past 2 weeks and as a result has had some increasing pain.  He endorses increased leg pain and zingers down to his leg.  These have increased in frequency.  No new frank weakness, no saddle anesthesia.  He continues to take gabapentin  for pain.    PHYSICAL EXAMINATION:  General: Patient is well developed, well nourished, calm, collected, and in no apparent distress.   NEUROLOGICAL:  General: In no acute distress.   Awake, alert, oriented to person, place, and time.  Pupils equal round and reactive to light.  Facial tone is symmetric.     Strength:            Side Iliopsoas Quads Hamstring PF DF EHL  R 5 5 5 5 5 5   L 5 5 5 5 5 5    Incision c/d/i   ROS (Neurologic):  Negative except as noted above  IMAGING: No new imaging  ASSESSMENT/PLAN:  Ethan SQUIBB Shams, Ethan Booth is doing well approximately 11 weeks status post right sided L5-S1 decompression with synovial cyst resection.  He has had a flare over the past couple weeks with increased travel and movement.  Advised patient to rest as much as possible and to take it easy not to lift heavy or do any intense movements.  Plan moving forward includes the following:  -Increase gabapentin . - Referral made for physical therapy - Potential steroid Dosepak in the future.  Patient will reach out and let me know how his pain is doing.   Lyle Decamp, PA-C Department of neurosurgery

## 2024-11-23 ENCOUNTER — Telehealth: Payer: Self-pay | Admitting: Physician Assistant

## 2024-11-23 ENCOUNTER — Other Ambulatory Visit

## 2024-11-23 ENCOUNTER — Other Ambulatory Visit: Payer: Self-pay | Admitting: Physician Assistant

## 2024-11-23 DIAGNOSIS — M5416 Radiculopathy, lumbar region: Secondary | ICD-10-CM

## 2024-11-23 NOTE — Telephone Encounter (Signed)
 Patient called to let our office know that his pain has worsened since he saw Viola last week. He states that Friday and Saturday and when he went to get out of bed and he put his right foot down his pain shot up to a 5/10. He states that he had a remainder of a steroid pack from his PCP that he has been taking but he would like to know what he needs to do. Should he begin another dose pack if prescribed or should an updated MRI be ordered. Please advise.   [facility] on [address].

## 2024-11-23 NOTE — Telephone Encounter (Signed)
 Please let the patient know that a new MRI has been placed. I will send this over to scheduling.

## 2024-11-24 ENCOUNTER — Inpatient Hospital Stay
Admission: RE | Admit: 2024-11-24 | Discharge: 2024-11-24 | Attending: Physician Assistant | Admitting: Physician Assistant

## 2024-11-24 DIAGNOSIS — M5416 Radiculopathy, lumbar region: Secondary | ICD-10-CM

## 2024-11-24 NOTE — Telephone Encounter (Signed)
 No, now that he has had the scan you can disregard the message.

## 2024-11-26 ENCOUNTER — Other Ambulatory Visit: Payer: Self-pay | Admitting: Physician Assistant

## 2024-11-26 ENCOUNTER — Telehealth: Payer: Self-pay

## 2024-11-26 MED ORDER — GABAPENTIN 300 MG PO CAPS: 900.0000 mg | ORAL_CAPSULE | Freq: Three times a day (TID) | ORAL | 2 refills | Status: AC

## 2024-11-26 NOTE — Telephone Encounter (Signed)
 Brooke,  [employer] for gabapentin  was sent to [facility] with incorrect directions. I see where this patient has been getting 270 capsules filled for 30 day scripts since September. You wrote a script for 270 capsules, but it is for 1 capsule QID for 68 days. They are requesting a new script be sent for 3 capsules 3 times a day please.

## 2024-11-29 ENCOUNTER — Telehealth: Payer: Self-pay | Admitting: Physician Assistant

## 2024-11-29 ENCOUNTER — Encounter: Admitting: Physician Assistant

## 2024-11-29 NOTE — Telephone Encounter (Signed)
 Patient is calling to let our office know that he has seen his MRI results on MyChart and would like to know what the next step is.

## 2024-11-30 ENCOUNTER — Ambulatory Visit: Admitting: Sports Medicine

## 2024-11-30 VITALS — BP 130/80 | Ht 71.5 in | Wt 174.0 lb

## 2024-11-30 DIAGNOSIS — M7138 Other bursal cyst, other site: Secondary | ICD-10-CM

## 2024-11-30 DIAGNOSIS — M48062 Spinal stenosis, lumbar region with neurogenic claudication: Secondary | ICD-10-CM

## 2024-11-30 NOTE — Assessment & Plan Note (Signed)
 It appears that after excision of the cyst gave him such good relief that this cyst or also a seroma is collecting in the same area I think this needs to be reviewed by neurosurgeon to see if further intervention is needed

## 2024-11-30 NOTE — Progress Notes (Signed)
 Discussed the use of AI scribe software for clinical note transcription with the patient, who gave verbal consent to proceed.  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  Right lower extremity radicular pain - Recurrent right leg pain following outpatient excision of a large synovial cyst compressing the right L5 on September 2nd - Complete pain relief for approximately two months postoperatively while adhering to lifting and bending restrictions - Approximately two months post-op, onset of intermittent mild 'zinger' pain radiating down the right leg with activities such as reaching or using the toilet, initially rated 1-2/10 - Two weeks ago, increased frequency of pain to 2-3 times daily, associated with increased back irritation during a trip to [place] - Over the past weekend, escalation of pain intensity to 5-6/10, particularly on first getting out of bed - Sensation of something 'touching the nerve' when transitioning from lying to standing - Right leg pain with full weight bearing, necessitating use of a cane - No leg weakness or new sensory changes - Prior to surgery, experienced severe recurrent right calf pain up to 10/10 and buttock tenderness  Response to treatment - Initiated a 5-day prednisone dose pack prior to starting physical therapy, resulting in reduction of pain to baseline by the last day of treatment  Left lower extremity paresthesia and varicosities - Previous left-sided tingling and numbness have improved - Compression stockings for varicose veins provide relief of left-sided symptoms  Imaging - MRI obtained last Wednesday for evaluation of recurrent symptoms  Physical Exam Pleasant Asian male in no acute distress BP 130/80   Ht 5' 11.5 (1.816 m)   Wt 174 lb (78.9 kg)   BMI 23.93 kg/m   MUSCULOSKELETAL:  Full flexion lumbar spine with knees bent.  Radicular pain at 10  degrees back extension.  Right lateral bend 15-20 degrees without radicular symptoms. Left lateral bend 30 degrees without radicular symptoms.  Rotation left or right causes radicular symptoms. Radicular symptoms standing on toes or heels. Antalgic gait, abducts right leg, uses cane, relies on left leg.  MRI reviewed There is again a collection of fluid in the L5-S1 area that looks like a recurrence of a synovial cyst There is some multiple degenerative change noted in his lumbar spine L3-4 shows narrowing L4-5 shows narrowing L5-S1 shows narrowing and what appears to be impingement There is a lack of clear fluid on the anterior space suggestive of possible spinal stenosis

## 2024-11-30 NOTE — Assessment & Plan Note (Signed)
 I think his spinal stenosis is symptomatic and may be present more than 1 level As he is already on gabapentin  900 3 times daily I do not think there is a lot of role for medical management in relieving his symptoms I do not think PT will help as he already has so much pain with limited testing in the office I would suggest keeping up the easy range of motion of the back Some short walks continuing to use his cane  Consultation with neurosurgery to see if he needs more aggressive surgery such as a back fusion or other interventions to allow him to get back to a more normal lifestyle

## 2024-12-01 ENCOUNTER — Ambulatory Visit: Payer: Self-pay | Admitting: Physician Assistant

## 2024-12-01 ENCOUNTER — Other Ambulatory Visit: Payer: Self-pay | Admitting: Physician Assistant

## 2024-12-01 DIAGNOSIS — M48062 Spinal stenosis, lumbar region with neurogenic claudication: Secondary | ICD-10-CM

## 2024-12-03 ENCOUNTER — Ambulatory Visit
Admission: RE | Admit: 2024-12-03 | Discharge: 2024-12-03 | Disposition: A | Source: Ambulatory Visit | Attending: Physician Assistant | Admitting: Physician Assistant

## 2024-12-03 ENCOUNTER — Other Ambulatory Visit: Payer: Self-pay | Admitting: Physician Assistant

## 2024-12-03 DIAGNOSIS — M48062 Spinal stenosis, lumbar region with neurogenic claudication: Secondary | ICD-10-CM

## 2024-12-03 NOTE — Telephone Encounter (Signed)
 Brooke notified patient through MyChart of results and next steps on 12/01/2024.

## 2024-12-15 ENCOUNTER — Other Ambulatory Visit (HOSPITAL_COMMUNITY)

## 2024-12-21 ENCOUNTER — Other Ambulatory Visit: Payer: Self-pay | Admitting: Urology

## 2025-01-09 ENCOUNTER — Other Ambulatory Visit: Payer: Self-pay | Admitting: Sports Medicine

## 2025-01-10 ENCOUNTER — Ambulatory Visit: Admitting: Neurosurgery

## 2025-01-10 ENCOUNTER — Encounter: Payer: Self-pay | Admitting: Neurosurgery

## 2025-01-10 VITALS — BP 132/78 | Ht 71.5 in | Wt 170.0 lb

## 2025-01-10 DIAGNOSIS — M96842 Postprocedural seroma of a musculoskeletal structure following a musculoskeletal system procedure: Secondary | ICD-10-CM | POA: Diagnosis not present

## 2025-01-10 DIAGNOSIS — M48062 Spinal stenosis, lumbar region with neurogenic claudication: Secondary | ICD-10-CM

## 2025-01-10 DIAGNOSIS — Z09 Encounter for follow-up examination after completed treatment for conditions other than malignant neoplasm: Secondary | ICD-10-CM

## 2025-01-10 MED ORDER — METHOCARBAMOL 500 MG PO TABS: 500.0000 mg | ORAL_TABLET | Freq: Three times a day (TID) | ORAL | 0 refills | Status: AC | PRN

## 2025-01-10 NOTE — Progress Notes (Signed)
 "  REFERRING PHYSICIAN:  Dwight Trula SQUIBB, Ethan Booth [address] [place],  [place] [address]  DOS: R L5-S1 decompresion w/ synovial cyst resection 08/24/24  Discussed the use of AI scribe software for clinical note transcription with the patient, who gave verbal consent to proceed.  History of Present Illness Ethan SQUIBB Shams, Ethan Booth is a 69 year old male with prior right-sided L5-S1 decompression and synovial cyst resection who presents with worsening deep right calf  and gluteal pain.  He has persistent deep aching pain in the right gluteal region, right calf and lower back.. Pain is sometimes elicited by palpation, rated 8/10 this morning, and on Saturday was severe enough that he required a cane to ambulate. Symptoms have gradually worsened. Coughing or sneezing causes a transient bulging sensation in the lower back and briefly worsens the pain. He denies radiation beyond the gluteus and gastrocnemius, dermatomal sensory loss, or significant weakness.  He notes this pain feels different from prior episodes, now localized to deep muscle and sometimes present in three distinct muscular areas. He has nocturnal low back soreness and tightness. He has modified his seating at home and cannot sit comfortably on the sofa.  For pain he takes tramadol  twice daily with acetaminophen  with partial relief. He uses methocarbamol  at night for muscle tightness and sleep but has a limited supply. A 5-day steroid course gave only transient improvement. He currently takes gabapentin  900 mg three times daily but is considering tapering due to sedation and lack of neuropathic pain.  Walking improves symptoms. After walking laps at the [facility] he can walk up to 1.5 miles on varied terrain without pain during activity, but has increased pain and fatigue 3 to 4 hours later. He reports increased fatigue with need for two naps daily and feels his muscles are deconditioned. He remains active with light resistance exercises and  stationary biking and is motivated to maintain function, though he feels his activity level may make symptoms more noticeable.  PHYSICAL EXAMINATION:  General: Patient is well developed, well nourished, calm, collected, and in no apparent distress.   NEUROLOGICAL:  General: In no acute distress.   Awake, alert, oriented to person, place, and time.  Pupils equal round and reactive to light.  Facial tone is symmetric.     Strength:            Side Iliopsoas Quads Hamstring PF DF EHL  R 5 5 5 5 5 5   L 5 5 5 5 5 5    Incision c/d/i   ROS (Neurologic):  Negative except as noted above  IMAGING: No new imaging  Assessment and Plan Assessment & Plan Lumbosacral radiculopathy with reinnervation pain, status post right L5-S1 decompression and synovial cyst resection  He experiences deep myotomal pain in the gluteus and gastrocnemius regions, consistent with reinnervation pain following right L5-S1 decompression and synovial cyst resection. The pain improves with activity, which is atypical for recurrent nerve compression or spinal stenosis. No evidence of recurrent cyst or compressive pathology is present on examination or imaging. Diabetes may be contributing to delayed nerve recovery. His physical activity may accentuate symptoms. Spinal fusion not currently indicated due to absence of compressive pathology and expected improvement with conservative management. Reinnervation pain typically lasts 1-2 months and generally resolves over time. - Prescribed methocarbamol  for muscle relaxation; recommended regular use, not limited to nighttime, to target muscle pain. - Advised tapering gabapentin  by reducing one 300 mg capsule every two weeks, starting with earlier doses, due  to lack of neuropathic pain and excessive sedation. - Discussed discontinuation of tramadol  as he transitions to muscle relaxant therapy. - Encouraged continued physical activity, including walking and stationary biking, to  the edge of soreness. - Provided education regarding the typical course of reinnervation pain and expected improvement. - Recommended acetaminophen  or ibuprofen as needed for additional analgesia, noting muscle relaxants are likely to be more effective for his current pain pattern. - Advised against spinal fusion at this time given lack of compressive pathology and likelihood of improvement with conservative management.  Postoperative seroma of lumbar region He has a subfascial postoperative seroma in the lumbar region, noncompressive from nerve roots, with  evidence of nerve compression or recurrence of cyst. The seroma is not contributing to current symptoms. - Provided reassurance regarding the benign nature of the seroma. - No intervention required at this time.  Ethan LELON Sharps Ethan Booth    "

## 2025-01-14 NOTE — Patient Instructions (Addendum)
 SURGICAL WAITING ROOM VISITATION Patients having surgery or a procedure may have no more than 2 support people in the waiting area - these visitors may rotate.    Children under the age of 14 will not be allowed to visit due to the increase in respiratory illness  Children under the age of 29 must have an adult with them who is not the patient.  If the patient needs to stay at the hospital during part of their recovery, the visitor guidelines for inpatient rooms apply. Pre-op nurse will coordinate an appropriate time for 1 support person to accompany patient in pre-op.  This support person may not rotate.    Please refer to the [facility] website for the visitor guidelines for Inpatients (after your surgery is over and you are in a regular room).    Your procedure is scheduled on: 01-25-25   Report to [facility] Main Entrance    Report to admitting at 6:45 AM   Call this number if you have problems the morning of surgery [phone]   Do not eat food or drink liquids :After Midnight.          If you have questions, please contact your surgeons office.   FOLLOW  ANY ADDITIONAL PRE OP INSTRUCTIONS YOU RECEIVED FROM YOUR SURGEON'S OFFICE!!!     Oral Hygiene is also important to reduce your risk of infection.                                    Remember - BRUSH YOUR TEETH THE MORNING OF SURGERY WITH YOUR REGULAR TOOTHPASTE   Take these medicines the morning of surgery with A SIP OF WATER:    Ezetimibe (Zetia)   Gabapentin  (Neurontin )   Okay to use inhalers   If needed Tylenol , Tramadol   Stop all vitamins and herbal supplements 7 days before surgery  How to Manage Your Diabetes Before and After Surgery  Why is it important to control my blood sugar before and after surgery? Improving blood sugar levels before and after surgery helps healing and can limit problems. A way of improving blood sugar control is eating a healthy diet by:  Eating less sugar and  carbohydrates  Increasing activity/exercise  Talking with your doctor about reaching your blood sugar goals High blood sugars (greater than 180 mg/dL) can raise your risk of infections and slow your recovery, so you will need to focus on controlling your diabetes during the weeks before surgery. Make sure that the doctor who takes care of your diabetes knows about your planned surgery including the date and location.  How do I manage my blood sugar before surgery? Check your blood sugar at least 4 times a day, starting 2 days before surgery, to make sure that the level is not too high or low. Check your blood sugar the morning of your surgery when you wake up and every 2 hours until you get to the Short Stay unit. If your blood sugar is less than 70 mg/dL, you will need to treat for low blood sugar: Do not take insulin. Treat a low blood sugar (less than 70 mg/dL) with  cup of clear juice (cranberry or apple), 4 glucose tablets, OR glucose gel. Recheck blood sugar in 15 minutes after treatment (to make sure it is greater than 70 mg/dL). If your blood sugar is not greater than 70 mg/dL on recheck, call [phone] for further instructions.  Report your blood sugar to the short stay nurse when you get to Short Stay.  If you are admitted to the hospital after surgery: Your blood sugar will be checked by the staff and you will probably be given insulin after surgery (instead of oral diabetes medicines) to make sure you have good blood sugar levels. The goal for blood sugar control after surgery is 80-180 mg/dL.   WHAT DO I DO ABOUT MY DIABETES MEDICATION?  Do not take oral diabetes medicines (pills) the morning of surgery.(Do not take Metformin the morning of surgery)  For patients with insulin pumps: Contact your diabetes doctor for specific instructions before surgery. Decrease basal rates by 20% at midnight the night before your surgery. Note that if your surgery is planned to be longer  than 2 hours, your insulin pump will be removed and intravenous (IV) insulin will be started and managed by the nurses and the anesthesiologist. You will be able to restart your insulin pump once you are awake and able to manage it.  Make sure to bring insulin pump supplies to the hospital with you in case the  site needs to be changed.  Reviewed and Endorsed by [facility] Patient Education Committee, August 2015                              You may not have any metal on your body including  jewelry, and body piercing             Do not wear  lotions, powders, cologne, or deodorant              Men may shave face and neck.   Do not bring valuables to the hospital. [facility] IS NOT RESPONSIBLE   FOR VALUABLES.   Contacts, dentures or bridgework may not be worn into surgery.   Bring small overnight bag day of surgery.   DO NOT BRING YOUR HOME MEDICATIONS TO THE HOSPITAL. PHARMACY WILL DISPENSE MEDICATIONS LISTED ON YOUR MEDICATION LIST TO YOU DURING YOUR ADMISSION IN THE HOSPITAL!    Special Instructions: Bring a copy of your healthcare power of attorney and living will documents the day of surgery if you haven't scanned them before.              Please read over the following fact sheets you were given: IF YOU HAVE QUESTIONS ABOUT YOUR PRE-OP INSTRUCTIONS PLEASE CALL [phone]  If you received a COVID test during your pre-op visit  it is requested that you wear a mask when out in public, stay away from anyone that may not be feeling well and notify your surgeon if you develop symptoms. If you test positive for Covid or have been in contact with anyone that has tested positive in the last 10 days please notify you surgeon.  [facility] - Preparing for Surgery Before surgery, you can play an important role.  Because skin is not sterile, your skin needs to be as free of germs as possible.  You can reduce the number of germs on your skin by washing with CHG (chlorahexidine  gluconate) soap before surgery.  CHG is an antiseptic cleaner which kills germs and bonds with the skin to continue killing germs even after washing. Please DO NOT use if you have an allergy to CHG or antibacterial soaps.  If your skin becomes reddened/irritated stop using the CHG and inform your nurse when you arrive at Short  Stay. Do not shave (including legs and underarms) for at least 48 hours prior to the first CHG shower.  You may shave your face/neck.  Please follow these instructions carefully:  1.  Shower with CHG Soap the night before surgery and the  morning of surgery.  2.  If you choose to wash your hair, wash your hair first as usual with your normal  shampoo.  3.  After you shampoo, rinse your hair and body thoroughly to remove the shampoo.                             4.  Use CHG as you would any other liquid soap.  You can apply chg directly to the skin and wash.  Gently with a scrungie or clean washcloth.  5.  Apply the CHG Soap to your body ONLY FROM THE NECK DOWN.   Do   not use on face/ open                           Wound or open sores. Avoid contact with eyes, ears mouth and   genitals (private parts).                       Wash face,  Genitals (private parts) with your normal soap.             6.  Wash thoroughly, paying special attention to the area where your    surgery  will be performed.  7.  Thoroughly rinse your body with warm water from the neck down.  8.  DO NOT shower/wash with your normal soap after using and rinsing off the CHG Soap.                9.  Pat yourself dry with a clean towel.            10.  Wear clean pajamas.            11.  Place clean sheets on your bed the night of your first shower and do not  sleep with pets. Day of Surgery : Do not apply any lotions/deodorants the morning of surgery.  Please wear clean clothes to the hospital/surgery center.  FAILURE TO FOLLOW THESE INSTRUCTIONS MAY RESULT IN THE CANCELLATION OF YOUR SURGERY  PATIENT  SIGNATURE_________________________________  NURSE SIGNATURE__________________________________  ________________________________________________________________________

## 2025-01-17 ENCOUNTER — Encounter (HOSPITAL_COMMUNITY): Payer: Self-pay

## 2025-01-17 ENCOUNTER — Encounter (HOSPITAL_COMMUNITY)
Admission: RE | Admit: 2025-01-17 | Discharge: 2025-01-17 | Disposition: A | Discharge status: Home / Self Care | Source: Ambulatory Visit | Attending: Urology | Admitting: Urology

## 2025-01-17 ENCOUNTER — Other Ambulatory Visit: Payer: Self-pay

## 2025-01-17 DIAGNOSIS — E119 Type 2 diabetes mellitus without complications: Secondary | ICD-10-CM

## 2025-01-17 HISTORY — DX: Unspecified osteoarthritis, unspecified site: M19.90

## 2025-01-17 NOTE — Progress Notes (Signed)
 Date of COVID positive in last 90 days:  PCP - Trula Brim, MD Cardiologist - saw Dr. Claudene years ago for family hx Endo - Dr. Tommas   Chest CT- 07/26/24 Epic Chest x-ray - N/A EKG - 08/25/24 Epic Calcium CT- 03/28/21 Epic Stress Test - N/A ECHO - N/A Cardiac Cath - N/A Pacemaker/ICD device last checked:N/A Spinal Cord Stimulator:N/A  Bowel Prep - N/A  Sleep Study - N/A CPAP -   Fasting Blood Sugar - 90-120 Checks Blood Sugar has CGM to abdomen   Has Insulin pump- DM coordinator aware  Last dose of GLP1 agonist-  N/A GLP1 instructions:  Do not take after     Last dose of SGLT-2 inhibitors-  N/A SGLT-2 instructions:  Do not take after     Blood Thinner Instructions: N/A Last dose:   Time: Aspirin Instructions: ASA 81, holding 10 days Last Dose:  Activity level: Can go up a flight of stairs and perform activities of daily living without stopping and without symptoms of chest pain or shortness of breath. Using cane with ambulation. Has weakness to right side post spinal surgery.  Anesthesia review: N/A  Patient denies shortness of breath, fever, cough and chest pain at PAT appointment  Patient verbalized understanding of instructions that were given to them at the PAT appointment. Patient was also instructed that they will need to review over the PAT instructions again at home before surgery.

## 2025-01-18 ENCOUNTER — Encounter (HOSPITAL_COMMUNITY): Admission: RE | Admit: 2025-01-18

## 2025-01-20 ENCOUNTER — Encounter (HOSPITAL_COMMUNITY)
Admission: RE | Admit: 2025-01-20 | Discharge: 2025-01-20 | Disposition: A | Discharge status: Home / Self Care | Source: Ambulatory Visit | Attending: Urology | Admitting: Urology

## 2025-01-20 DIAGNOSIS — E119 Type 2 diabetes mellitus without complications: Secondary | ICD-10-CM | POA: Insufficient documentation

## 2025-01-20 DIAGNOSIS — Z794 Long term (current) use of insulin: Secondary | ICD-10-CM | POA: Diagnosis not present

## 2025-01-20 DIAGNOSIS — Z01812 Encounter for preprocedural laboratory examination: Secondary | ICD-10-CM | POA: Insufficient documentation

## 2025-01-20 LAB — BASIC METABOLIC PANEL WITH GFR
Anion gap: 9 (ref 5–15)
BUN: 14 mg/dL (ref 8–23)
CO2: 27 mmol/L (ref 22–32)
Calcium: 9.9 mg/dL (ref 8.9–10.3)
Chloride: 101 mmol/L (ref 98–111)
Creatinine, Ser: 0.78 mg/dL (ref 0.61–1.24)
GFR, Estimated: >60 mL/min
Glucose, Bld: 86 mg/dL (ref 70–99)
Potassium: 4 mmol/L (ref 3.5–5.1)
Sodium: 137 mmol/L (ref 135–145)

## 2025-01-20 LAB — HEMOGLOBIN A1C
Hgb A1c MFr Bld: 6.2 % — ABNORMAL HIGH (ref 4.8–5.6)
Mean Plasma Glucose: 131.24 mg/dL

## 2025-01-20 NOTE — Progress Notes (Signed)
 CBG 134 Dexcom Right ARM

## 2025-01-24 ENCOUNTER — Encounter (HOSPITAL_COMMUNITY): Payer: Self-pay | Admitting: Urology

## 2025-01-24 NOTE — Anesthesia Preprocedure Evaluation (Signed)
"                                    Anesthesia Evaluation  Patient identified by MRN, date of birth, ID band Patient awake    Reviewed: Allergy & Precautions, H&P , NPO status , Patient's Chart, lab work & pertinent test results, reviewed documented beta blocker date and time   History of Anesthesia Complications Negative for: history of anesthetic complications  Airway Mallampati: III  TM Distance: >3 FB Neck ROM: full    Dental  (+) Dental Advidsory Given, Implants, Caps, Teeth Intact, Missing, Poor Dentition, Loose,    Pulmonary neg pulmonary ROS, neg shortness of breath, asthma , neg COPD   Pulmonary exam normal breath sounds clear to auscultation       Cardiovascular Exercise Tolerance: Good negative cardio ROS Normal cardiovascular exam Rhythm:regular Rate:Normal     Neuro/Psych neg Seizures  Neuromuscular disease negative neurological ROS  negative psych ROS   GI/Hepatic negative GI ROS, Neg liver ROS,GERD  ,,  Endo/Other  diabetes, Well Controlled, Insulin  Dependent    Renal/GU negative Renal ROS  negative genitourinary   Musculoskeletal  (+) Arthritis ,    Abdominal   Peds  Hematology negative hematology ROS (+)   Anesthesia Other Findings Past Medical History: No date: Asthma     Comment:  well controlled No date: BPH (benign prostatic hyperplasia) No date: DDD (degenerative disc disease), lumbar No date: DM (diabetes mellitus), type 2 (HCC) No date: Dyslipidemia No date: Dysplastic nevi     Comment:  pre-cancerous No date: GERD (gastroesophageal reflux disease) No date: Hearing loss of left ear     Comment:  wears hearing aid No date: High triglycerides No date: Insomnia No date: Insulin  pump in place No date: Low testosterone No date: Low vitamin D level No date: Lumbosacral radiculopathy No date: Spinal stenosis of lumbar region No date: Varicose vein of leg   Reproductive/Obstetrics negative OB ROS                               Anesthesia Physical Anesthesia Plan  ASA: 2  Anesthesia Plan: General   Post-op Pain Management: Tylenol  PO (pre-op)*   Induction: Intravenous  PONV Risk Score and Plan: 2 and Ondansetron , Dexamethasone , Midazolam  and Treatment may vary due to age or medical condition  Airway Management Planned: Oral ETT and LMA  Additional Equipment: None  Intra-op Plan:   Post-operative Plan: Extubation in OR  Informed Consent: I have reviewed the patients History and Physical, chart, labs and discussed the procedure including the risks, benefits and alternatives for the proposed anesthesia with the patient or authorized representative who has indicated his/her understanding and acceptance.     Dental Advisory Given  Plan Discussed with: Anesthesiologist, CRNA and Surgeon  Anesthesia Plan Comments:          Anesthesia Quick Evaluation  "

## 2025-01-25 ENCOUNTER — Encounter (HOSPITAL_COMMUNITY): Admission: RE | Disposition: A | Payer: Self-pay | Source: Home / Self Care | Attending: Urology

## 2025-01-25 ENCOUNTER — Encounter (HOSPITAL_COMMUNITY): Admitting: Anesthesiology

## 2025-01-25 ENCOUNTER — Ambulatory Visit (HOSPITAL_COMMUNITY): Payer: Self-pay | Admitting: Medical

## 2025-01-25 ENCOUNTER — Other Ambulatory Visit: Payer: Self-pay

## 2025-01-25 ENCOUNTER — Observation Stay (HOSPITAL_COMMUNITY): Admission: RE | Admit: 2025-01-25 | Discharge: 2025-01-26 | Disposition: A | Attending: Urology | Admitting: Urology

## 2025-01-25 DIAGNOSIS — Z7984 Long term (current) use of oral hypoglycemic drugs: Secondary | ICD-10-CM | POA: Insufficient documentation

## 2025-01-25 DIAGNOSIS — N400 Enlarged prostate without lower urinary tract symptoms: Secondary | ICD-10-CM | POA: Diagnosis not present

## 2025-01-25 DIAGNOSIS — J45909 Unspecified asthma, uncomplicated: Secondary | ICD-10-CM | POA: Insufficient documentation

## 2025-01-25 DIAGNOSIS — E119 Type 2 diabetes mellitus without complications: Secondary | ICD-10-CM | POA: Insufficient documentation

## 2025-01-25 DIAGNOSIS — N138 Other obstructive and reflux uropathy: Secondary | ICD-10-CM | POA: Diagnosis present

## 2025-01-25 DIAGNOSIS — N401 Enlarged prostate with lower urinary tract symptoms: Principal | ICD-10-CM | POA: Diagnosis present

## 2025-01-25 DIAGNOSIS — N139 Obstructive and reflux uropathy, unspecified: Secondary | ICD-10-CM | POA: Insufficient documentation

## 2025-01-25 DIAGNOSIS — Z794 Long term (current) use of insulin: Secondary | ICD-10-CM | POA: Insufficient documentation

## 2025-01-25 LAB — GLUCOSE, CAPILLARY: Glucose-Capillary: 120 mg/dL — ABNORMAL HIGH (ref 70–99)

## 2025-01-25 LAB — HIV ANTIBODY (ROUTINE TESTING W REFLEX): HIV Screen 4th Generation wRfx: NONREACTIVE

## 2025-01-25 MED ORDER — INSULIN PUMP
Freq: Three times a day (TID) | SUBCUTANEOUS | Status: DC
  Administered 2025-01-26: 4.4 via SUBCUTANEOUS
  Filled 2025-01-25: qty 1

## 2025-01-25 MED ORDER — ACETAMINOPHEN 325 MG PO TABS: 650.0000 mg | ORAL_TABLET | ORAL | Status: DC | PRN

## 2025-01-25 MED ORDER — EPHEDRINE 5 MG/ML INJ
INTRAVENOUS | Status: AC
  Filled 2025-01-25: qty 5

## 2025-01-25 MED ORDER — GABAPENTIN 300 MG PO CAPS: 600.0000 mg | ORAL_CAPSULE | ORAL | Status: DC

## 2025-01-25 MED ORDER — METHOCARBAMOL 500 MG PO TABS: 500.0000 mg | ORAL_TABLET | Freq: Three times a day (TID) | ORAL | Status: DC | PRN

## 2025-01-25 MED ORDER — VALACYCLOVIR HCL 500 MG PO TABS
1000.0000 mg | ORAL_TABLET | Freq: Every day | ORAL | Status: DC
  Administered 2025-01-25: 1000 mg via ORAL
  Filled 2025-01-25: qty 2

## 2025-01-25 MED ORDER — OXYCODONE HCL 5 MG PO TABS: 5.0000 mg | ORAL_TABLET | Freq: Once | ORAL | Status: DC | PRN

## 2025-01-25 MED ORDER — ZOLPIDEM TARTRATE 5 MG PO TABS: 5.0000 mg | ORAL_TABLET | Freq: Every evening | ORAL | Status: DC | PRN

## 2025-01-25 MED ORDER — POTASSIUM CHLORIDE IN NACL 20-0.45 MEQ/L-% IV SOLN
INTRAVENOUS | Status: DC
  Filled 2025-01-25 (×3): qty 1000

## 2025-01-25 MED ORDER — METFORMIN HCL ER 500 MG PO TB24
500.0000 mg | ORAL_TABLET | Freq: Two times a day (BID) | ORAL | Status: DC
  Administered 2025-01-26: 500 mg via ORAL
  Filled 2025-01-25 (×2): qty 1

## 2025-01-25 MED ORDER — TRAMADOL HCL 50 MG PO TABS
50.0000 mg | ORAL_TABLET | Freq: Four times a day (QID) | ORAL | Status: DC | PRN
  Administered 2025-01-25 – 2025-01-26 (×2): 50 mg via ORAL
  Filled 2025-01-25 (×2): qty 1

## 2025-01-25 MED ORDER — PHENYLEPHRINE 80 MCG/ML (10ML) SYRINGE FOR IV PUSH (FOR BLOOD PRESSURE SUPPORT)
PREFILLED_SYRINGE | INTRAVENOUS | Status: AC
  Filled 2025-01-25: qty 10

## 2025-01-25 MED ORDER — FENTANYL CITRATE (PF) 50 MCG/ML IJ SOSY
25.0000 ug | PREFILLED_SYRINGE | INTRAMUSCULAR | Status: DC | PRN
  Administered 2025-01-25: 50 ug via INTRAVENOUS

## 2025-01-25 MED ORDER — DIPHENHYDRAMINE HCL 50 MG/ML IJ SOLN: 12.5000 mg | Freq: Four times a day (QID) | INTRAMUSCULAR | Status: DC | PRN

## 2025-01-25 MED ORDER — EZETIMIBE 10 MG PO TABS
10.0000 mg | ORAL_TABLET | Freq: Every day | ORAL | Status: DC
  Administered 2025-01-26: 10 mg via ORAL
  Filled 2025-01-25: qty 1

## 2025-01-25 MED ORDER — ACETAMINOPHEN 500 MG PO TABS
1000.0000 mg | ORAL_TABLET | Freq: Once | ORAL | Status: AC
  Administered 2025-01-25: 1000 mg via ORAL
  Filled 2025-01-25: qty 2

## 2025-01-25 MED ORDER — MIDAZOLAM HCL 2 MG/2ML IJ SOLN
INTRAMUSCULAR | Status: AC
  Filled 2025-01-25: qty 2

## 2025-01-25 MED ORDER — FENTANYL CITRATE (PF) 100 MCG/2ML IJ SOLN
INTRAMUSCULAR | Status: AC
  Filled 2025-01-25: qty 2

## 2025-01-25 MED ORDER — CHLORHEXIDINE GLUCONATE 0.12 % MT SOLN
15.0000 mL | Freq: Once | OROMUCOSAL | Status: AC
  Administered 2025-01-25: 15 mL via OROMUCOSAL

## 2025-01-25 MED ORDER — KETOROLAC TROMETHAMINE 30 MG/ML IJ SOLN
INTRAMUSCULAR | Status: AC
  Filled 2025-01-25: qty 1

## 2025-01-25 MED ORDER — TRAMADOL HCL 50 MG PO TABS: 50.0000 mg | ORAL_TABLET | Freq: Two times a day (BID) | ORAL | 0 refills | Status: AC | PRN

## 2025-01-25 MED ORDER — DIPHENHYDRAMINE HCL 12.5 MG/5ML PO ELIX: 12.5000 mg | ORAL_SOLUTION | Freq: Four times a day (QID) | ORAL | Status: DC | PRN

## 2025-01-25 MED ORDER — LACTATED RINGERS IV SOLN: INTRAVENOUS | Status: DC

## 2025-01-25 MED ORDER — PROPOFOL 10 MG/ML IV BOLUS
INTRAVENOUS | Status: AC
  Filled 2025-01-25: qty 20

## 2025-01-25 MED ORDER — ORAL CARE MOUTH RINSE: 15.0000 mL | Freq: Once | OROMUCOSAL | Status: AC

## 2025-01-25 MED ORDER — MEPERIDINE HCL 25 MG/ML IJ SOLN: 6.2500 mg | INTRAMUSCULAR | Status: DC | PRN

## 2025-01-25 MED ORDER — ONDANSETRON HCL 4 MG/2ML IJ SOLN
INTRAMUSCULAR | Status: DC | PRN
  Administered 2025-01-25: 4 mg via INTRAVENOUS

## 2025-01-25 MED ORDER — ONDANSETRON HCL 4 MG/2ML IJ SOLN: 4.0000 mg | Freq: Once | INTRAMUSCULAR | Status: DC | PRN

## 2025-01-25 MED ORDER — FLEET ENEMA RE ENEM: 1.0000 | ENEMA | Freq: Once | RECTAL | Status: DC | PRN

## 2025-01-25 MED ORDER — GABAPENTIN 300 MG PO CAPS
300.0000 mg | ORAL_CAPSULE | Freq: Three times a day (TID) | ORAL | Status: DC
  Administered 2025-01-26: 300 mg via ORAL
  Filled 2025-01-25 (×2): qty 1

## 2025-01-25 MED ORDER — OXYCODONE HCL 5 MG/5ML PO SOLN: 5.0000 mg | Freq: Once | ORAL | Status: DC | PRN

## 2025-01-25 MED ORDER — FENTANYL CITRATE (PF) 100 MCG/2ML IJ SOLN
INTRAMUSCULAR | Status: DC | PRN
  Administered 2025-01-25 (×2): 25 ug via INTRAVENOUS
  Administered 2025-01-25: 50 ug via INTRAVENOUS
  Administered 2025-01-25: 25 ug via INTRAVENOUS

## 2025-01-25 MED ORDER — EPHEDRINE SULFATE (PRESSORS) 25 MG/5ML IV SOSY
PREFILLED_SYRINGE | INTRAVENOUS | Status: DC | PRN
  Administered 2025-01-25: 5 mg via INTRAVENOUS

## 2025-01-25 MED ORDER — MIDAZOLAM HCL 5 MG/5ML IJ SOLN
INTRAMUSCULAR | Status: DC | PRN
  Administered 2025-01-25: 2 mg via INTRAVENOUS

## 2025-01-25 MED ORDER — CEFAZOLIN SODIUM-DEXTROSE 2-4 GM/100ML-% IV SOLN
2.0000 g | INTRAVENOUS | Status: AC
  Administered 2025-01-25: 2 g via INTRAVENOUS
  Filled 2025-01-25: qty 100

## 2025-01-25 MED ORDER — ONDANSETRON HCL 4 MG/2ML IJ SOLN: 4.0000 mg | INTRAMUSCULAR | Status: DC | PRN

## 2025-01-25 MED ORDER — ALBUTEROL SULFATE (2.5 MG/3ML) 0.083% IN NEBU: 2.5000 mg | INHALATION_SOLUTION | Freq: Four times a day (QID) | RESPIRATORY_TRACT | Status: DC | PRN

## 2025-01-25 MED ORDER — PHENYLEPHRINE HCL (PRESSORS) 10 MG/ML IV SOLN
INTRAVENOUS | Status: DC | PRN
  Administered 2025-01-25 (×5): 120 ug via INTRAVENOUS
  Administered 2025-01-25: 80 ug via INTRAVENOUS
  Administered 2025-01-25: 120 ug via INTRAVENOUS

## 2025-01-25 MED ORDER — LORAZEPAM 1 MG PO TABS
1.0000 mg | ORAL_TABLET | Freq: Every day | ORAL | Status: DC
  Administered 2025-01-25: 1 mg via ORAL
  Filled 2025-01-25: qty 1

## 2025-01-25 MED ORDER — LIDOCAINE HCL (CARDIAC) PF 100 MG/5ML IV SOSY
PREFILLED_SYRINGE | INTRAVENOUS | Status: DC | PRN
  Administered 2025-01-25: 80 mg via INTRAVENOUS

## 2025-01-25 MED ORDER — BISACODYL 10 MG RE SUPP: 10.0000 mg | Freq: Every day | RECTAL | Status: DC | PRN

## 2025-01-25 MED ORDER — DEXAMETHASONE SODIUM PHOSPHATE 4 MG/ML IJ SOLN
INTRAMUSCULAR | Status: DC | PRN
  Administered 2025-01-25: 8 mg via INTRAVENOUS

## 2025-01-25 MED ORDER — SENNOSIDES-DOCUSATE SODIUM 8.6-50 MG PO TABS: 1.0000 | ORAL_TABLET | Freq: Every evening | ORAL | Status: DC | PRN

## 2025-01-25 MED ORDER — SODIUM CHLORIDE 0.9 % IR SOLN: 3000.0000 mL | Status: DC

## 2025-01-25 MED ORDER — SODIUM CHLORIDE 0.9 % IR SOLN
Status: DC | PRN
  Administered 2025-01-25: 3000 mL via INTRAVESICAL

## 2025-01-25 MED ORDER — FENTANYL CITRATE (PF) 50 MCG/ML IJ SOSY
PREFILLED_SYRINGE | INTRAMUSCULAR | Status: AC
  Filled 2025-01-25: qty 1

## 2025-01-25 MED ORDER — PROPOFOL 10 MG/ML IV BOLUS
INTRAVENOUS | Status: DC | PRN
  Administered 2025-01-25: 200 mg via INTRAVENOUS

## 2025-01-25 NOTE — Interval H&P Note (Signed)
 History and Physical Interval Note:  01/25/2025 8:33 AM  Ethan SHAUNNA Shams, MD  has presented today for surgery, with the diagnosis of BENIGN PROSTATIC HYPERPLASIA WITH URINARY SYMPTOMS.  The various methods of treatment have been discussed with the patient and family. After consideration of risks, benefits and other options for treatment, the patient has consented to  Procedures: INCISION, PROSTATE, TRANSURETHRAL (N/A) as a surgical intervention.  The patient's history has been reviewed, patient examined, no change in status, stable for surgery.  I have reviewed the patient's chart and labs.  Questions were answered to the patient's satisfaction.     [name]

## 2025-01-25 NOTE — Plan of Care (Signed)

## 2025-01-25 NOTE — Op Note (Signed)
 Procedure: Transurethral resection of the prostate.  Pre-Op diagnosis: BPH bladder outlet obstruction.  Postop diagnosis: Same.  Surgeon: Dr. Norleen Seltzer.  Anesthesia: General.  Specimen: Prostate chips.  Drains: 22 French three-way Foley catheter.  EBL: 5 mL  Complications: None.  Indications: Patient is a 69 year old male with bladder outlet obstruction with primarily a high stiff bladder neck and minimal lateral lobe enlargement with a small prostate.  It was felt that transurethral incision of the prostate versus transurethral resection of prostate was indicated but that that decision would be made at the time of surgery.  Procedure: He was taken the operating room where general anesthetic was induced.  He was given antibiotics.  He was placed in lithotomy position and fitted with PAS hose.  His perineum and genitalia were prepped with Betadine solution was draped in usual sterile fashion.  Cystoscopy was performed with a 21 French scope and 30 degree lens.  The meatus was slightly tight but I was able to get the scope and without dilation.  Examination revealed a normal urethra.  The external sphincter was intact.  The prostatic urethra was short with minimal lateral lobe enlargement with a high stiff bladder neck.  Inspection of bladder revealed moderate trabeculation without tumors, stones or inflammation.  Ureteral orifices were unremarkable.  Urethra was then calibrated to 28 French with [employer] sounds and the 24 French continuous-flow resectoscope sheath was placed with the aid of visual obturator.  The visual obturator was replaced and the [place] handle with the [facility] bipolar needle electrode.  Transurethral incision of the prostate was performed at 5 and 7:00 down fat and out to the verumontanum.  Once this had been performed the bladder neck did not open as satisfactorily as I would have liked and there was a segment of prostate tissue between the bladder neck and the  verumontanum that it was felt would be more appropriate to be resected.  I then switched to the bipolar loop and resected this section of prostate tissue which was primarily fibromuscular.  Once this had been completed and the bladder neck was widely patent, the bladder was evacuated free of chips and hemostasis was achieved.  Final inspection demonstrated no retained tissue, intact ureteral orifices and intact external sphincter.  No active bleeding was noted.  The scope was removed and pressure on the bladder produced an excellent stream.  A 22 French three-way Foley was placed with the aid of a catheter guide.  The balloon was filled with 30 mL of sterile fluid and the catheter was hand irrigated with clear return.  Irrigation port was plugged and the catheter was placed to straight drainage.  He was taken down from lithotomy position, his anesthetic was reversed and he was moved to recovery in stable condition.  There were no complications.

## 2025-01-25 NOTE — Anesthesia Postprocedure Evaluation (Signed)
"   Anesthesia Post Note  Patient: [name], MD  Procedure(s) Performed: INCISION, PROSTATE, TRANSURETHRAL     Patient location during evaluation: PACU Anesthesia Type: General Level of consciousness: awake and alert Pain management: pain level controlled Vital Signs Assessment: post-procedure vital signs reviewed and stable Respiratory status: spontaneous breathing, nonlabored ventilation, respiratory function stable and patient connected to nasal cannula oxygen Cardiovascular status: blood pressure returned to baseline and stable Postop Assessment: no apparent nausea or vomiting Anesthetic complications: no   No notable events documented.  Last Vitals:  Vitals:   01/25/25 1000 01/25/25 1015  BP: 121/76 123/74  Pulse: 91 89  Resp: 12 14  Temp: (!) 36.4 C   SpO2: 100% 98%    Last Pain:  Vitals:   01/25/25 1015  TempSrc:   PainSc: Asleep                 Temesha Queener      "

## 2025-01-25 NOTE — Transfer of Care (Signed)
 Immediate Anesthesia Transfer of Care Note  Patient: Ethan SHAUNNA Shams, MD  Procedure(s) Performed: Procedures: INCISION, PROSTATE, TRANSURETHRAL (N/A)  Patient Location: PACU  Anesthesia Type:General  Level of Consciousness:  sedated, patient cooperative and responds to stimulation  Airway & Oxygen Therapy:Patient Spontanous Breathing and Patient connected to face mask oxgen  Post-op Assessment:  Report given to PACU RN and Post -op Vital signs reviewed and stable  Post vital signs:  Reviewed and stable  Last Vitals:  Vitals:   01/25/25 0744  BP: (!) 134/92  Pulse: 93  Resp: 18  Temp: (!) 36.4 C  SpO2: 100%    Complications: No apparent anesthesia complications

## 2025-01-25 NOTE — Anesthesia Procedure Notes (Signed)
 Procedure Name: LMA Insertion Date/Time: 01/25/2025 9:08 AM  Performed by: Vincenzo Show, CRNAPre-anesthesia Checklist: Patient identified, Emergency Drugs available, Suction available, Patient being monitored and Timeout performed Patient Re-evaluated:Patient Re-evaluated prior to induction Oxygen Delivery Method: Circle system utilized Preoxygenation: Pre-oxygenation with 100% oxygen Induction Type: IV induction Ventilation: Mask ventilation without difficulty LMA: LMA inserted LMA Size: 4.0 Number of attempts: 1 Placement Confirmation: positive ETCO2 and breath sounds checked- equal and bilateral Tube secured with: Tape Dental Injury: Teeth and Oropharynx as per pre-operative assessment  Comments: Easy pass LMA.  Teeth unchanged.

## 2025-01-25 NOTE — Discharge Instructions (Signed)
 SABRA

## 2025-01-26 ENCOUNTER — Encounter (HOSPITAL_COMMUNITY): Payer: Self-pay | Admitting: Urology

## 2025-01-26 LAB — SURGICAL PATHOLOGY

## 2025-01-26 NOTE — Progress Notes (Signed)
" °   01/26/25 0902  TOC Brief Assessment  Insurance and Status Reviewed  Patient has primary care physician Yes  Home environment has been reviewed Resides in single family home with spouse  Prior level of function: Independent with ADLs at baseline  Prior/Current Home Services No current home services  Social Drivers of Health Review SDOH reviewed no interventions necessary  Readmission risk has been reviewed No  Transition of care needs no transition of care needs at this time    "

## 2025-01-26 NOTE — Plan of Care (Signed)
" °  Problem: Education: Goal: Knowledge of the prescribed therapeutic regimen will improve Outcome: Progressing   Problem: Bowel/Gastric: Goal: Gastrointestinal status for postoperative course will improve Outcome: Progressing   Problem: Cardiac: Goal: Ability to maintain an adequate cardiac output Outcome: Progressing Goal: Will show no evidence of cardiac arrhythmias Outcome: Progressing   "

## 2025-01-26 NOTE — Discharge Summary (Signed)
 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  Admit date: 01/25/2025 Discharge date: 01/26/2025  Admission Diagnoses:  BPH with obstruction/lower urinary tract symptoms  Discharge Diagnoses:  Principal Problem:   BPH with obstruction/lower urinary tract symptoms   Past Medical History:  Diagnosis Date   Arthritis    Asthma    well controlled   BPH (benign prostatic hyperplasia)    DDD (degenerative disc disease), lumbar    DM (diabetes mellitus), type 2 (HCC)    Dyslipidemia    Dysplastic nevi    pre-cancerous   GERD (gastroesophageal reflux disease)    Hearing loss of left ear    wears hearing aid   High triglycerides    Insomnia    Insulin  pump in place    Low testosterone    Low vitamin D level    Lumbosacral radiculopathy    Spinal stenosis of lumbar region    Varicose vein of leg     Surgeries: Procedures: INCISION, PROSTATE, TRANSURETHRAL on 01/25/2025   Consultants (if any):   Discharged Condition: Improved  Hospital Course: [name], 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  Foley removed this morning and he is voiding with a good stream but some urgency and dysuria.   Urine is light pink.  He passed a small clot.   He was given perioperative antibiotics:  Anti-infectives (From admission, onward)    Start     Dose/Rate Route Frequency Ordered Stop   01/25/25 2200  valACYclovir  (VALTREX ) tablet 1,000 mg       Note to Pharmacy: At night     1,000 mg Oral Daily at bedtime 01/25/25 1658     01/25/25 0744  ceFAZolin  (ANCEF ) IVPB 2g/100 mL premix        2 g 200 mL/hr over 30 Minutes Intravenous 30 min pre-op 01/25/25 0744 01/25/25 0859     .  He was given sequential compression devices for DVT prophylaxis.  He benefited maximally from  the hospital stay and there were no complications.    Inpatient Morphine Milligram Equivalents Per Day 2/3 - 2/4   Values displayed are in units of MME/Day    Order Start / End Date Yesterday Today    oxyCODONE  (Oxy IR/ROXICODONE ) immediate release tablet 5 mg 2/3 - 2/3 0 of Unknown --    oxyCODONE  (ROXICODONE ) 5 MG/5ML solution 5 mg 2/3 - 2/3 0 of Unknown --      Group total: 0 of Unknown     fentaNYL  (SUBLIMAZE ) injection 25-50 mcg 2/3 - 2/3 15 of 45-90 --    meperidine  (DEMEROL ) injection 6.25-12.5 mg 2/3 - 2/3 0 of 7.5-15 --    traMADol  (ULTRAM ) tablet 50 mg 2/3 - No end date 5 of 10 5 of 20    fentaNYL  (SUBLIMAZE ) injection 2/3 - 2/3 *37.5 of 37.5 --    Daily Totals  * 57.5 of Unknown (at least 100-152.5) 5 of 20  *One-Step medication  Calculation Errors     Order Type Date Details   oxyCODONE  (Oxy IR/ROXICODONE ) immediate release tablet 5 mg Ordered Dose -- Insufficient frequency information   oxyCODONE  (ROXICODONE ) 5 MG/5ML solution 5 mg Ordered Dose -- Insufficient frequency information            Recent vital signs:  Vitals:  01/26/25 0114 01/26/25 0515  BP: (!) 107/58 (!) 107/55  Pulse: 89 78  Resp: 18 20  Temp: 98.3 F (36.8 C) 97.8 F (36.6 C)  SpO2: 96% 97%    Recent laboratory studies:  No results found for: HGB No results found for: WBC, PLT No results found for: INR Lab Results  Component Value Date   NA 137 01/20/2025   K 4.0 01/20/2025   CL 101 01/20/2025   CO2 27 01/20/2025   BUN 14 01/20/2025   CREATININE 0.78 01/20/2025   GLUCOSE 86 01/20/2025    Discharge Medications:   Allergies as of 01/26/2025       Reactions   Hydrocodone Itching   Hydrocodone Bit-homatrop Mbr Itching   Jardiance [empagliflozin] Other (See Comments)   yeast   Metformin  Hcl Diarrhea   If he exceeds 500mg  per dose   Statins Diarrhea, Other (See Comments)   Flu like symptoms, stuttering   Morphine Itching, Rash        Medication List     STOP  taking these medications    silodosin 8 MG Caps capsule Commonly known as: RAPAFLO       TAKE these medications    acetaminophen  500 MG tablet Commonly known as: TYLENOL  Take 500 mg by mouth in the morning and at bedtime.   albuterol  108 (90 Base) MCG/ACT inhaler Commonly known as: VENTOLIN  HFA Inhale 1-2 puffs into the lungs every 6 (six) hours as needed for wheezing or shortness of breath.   aspirin EC 81 MG tablet Take 81 mg by mouth in the morning. Swallow whole.   calcium carbonate 1500 (600 Ca) MG Tabs tablet Commonly known as: OSCAL Take 600 mg by mouth daily.   Cholecalciferol 50 MCG (2000 UT) Tabs Take 6,000 Units by mouth at bedtime.   cyanocobalamin  1000 MCG tablet Commonly known as: VITAMIN B12 Take 1,000 mcg by mouth in the morning.   Dexcom G7 Sensor Misc   ezetimibe  10 MG tablet Commonly known as: ZETIA  Take 10 mg by mouth in the morning.   Fish Oil 1000 MG Caps Take 2,000 mg by mouth at bedtime.   gabapentin  300 MG capsule Commonly known as: NEURONTIN  Take 3 capsules (900 mg total) by mouth 3 (three) times daily. What changed:  how much to take when to take this additional instructions   HumaLOG 100 UNIT/ML injection Generic drug: insulin  lispro On insulin  pump   LORazepam  0.5 MG tablet Commonly known as: ATIVAN  Take 1 mg by mouth at bedtime.   metFORMIN  500 MG 24 hr tablet Commonly known as: GLUCOPHAGE -XR Take 500 mg by mouth 2 (two) times daily.   methocarbamol  500 MG tablet Commonly known as: ROBAXIN  Take 1 tablet (500 mg total) by mouth every 8 (eight) hours as needed for muscle spasms. What changed: when to take this   multivitamin with minerals Tabs tablet Take 1 tablet by mouth in the morning.   NONFORMULARY OR COMPOUNDED ITEM Apply 1 Application topically as needed (nail lacquer solution (toe nail fungus)). terbinafine solution with DMSO (dimethyl sulfoxide)   Omnipod 5 DexG7G6 Intro Gen 5 Kit HUMALOG INSULIN --INJECT UP  TO 50 UNITS PER DAY VIA OMNIPOD   oyster calcium 500 MG Tabs tablet Take 500 mg of elemental calcium by mouth in the morning.   Testosterone 20.25 MG/1.25GM (1.62%) Gel Apply 1 Pump topically daily.   traMADol  50 MG tablet Commonly known as: ULTRAM  Take 1 tablet (50 mg total) by mouth 2 (two) times daily as needed for  moderate pain (pain score 4-6) (pain.). What changed: reasons to take this   valACYclovir  1000 MG tablet Commonly known as: VALTREX  Take 1,000 mg by mouth daily. At night   Voltaren 1 % Gel Generic drug: diclofenac Sodium Apply 1 g topically 2 (two) times daily as needed (pain.).        Diagnostic Studies: No results found.  Disposition: Discharge disposition: 01-Home or Self Care       Discharge Instructions     Discontinue IV   Complete by: As directed         Follow-up Information     Watt Rush, MD Follow up on 02/15/2025.   Specialty: Urology Why: 1 pm Contact information: [address] [place] [address] [phone]                  Signed: Rush Watt 01/26/2025, 8:12 AM
